# Patient Record
Sex: Female | Born: 1970 | Hispanic: Yes | Marital: Married | Smoking: Never smoker
Health system: Southern US, Community
[De-identification: ages and names within clinical notes are randomized; demographics above are authoritative.]

## PROBLEM LIST (undated history)

## (undated) DIAGNOSIS — N63 Unspecified lump in unspecified breast: Secondary | ICD-10-CM

## (undated) DIAGNOSIS — D649 Anemia, unspecified: Secondary | ICD-10-CM

## (undated) HISTORY — DX: Unspecified lump in unspecified breast: N63.0

---

## 2007-02-23 ENCOUNTER — Observation Stay: Payer: Self-pay | Admitting: Unknown Physician Specialty

## 2010-01-25 ENCOUNTER — Ambulatory Visit: Payer: Self-pay | Admitting: Nurse Practitioner

## 2010-08-08 ENCOUNTER — Ambulatory Visit: Payer: Self-pay | Admitting: Surgery

## 2011-02-26 ENCOUNTER — Ambulatory Visit: Payer: Self-pay | Admitting: Surgery

## 2012-02-28 ENCOUNTER — Ambulatory Visit: Payer: Self-pay | Admitting: Surgery

## 2013-02-08 ENCOUNTER — Ambulatory Visit: Payer: Self-pay | Admitting: Nurse Practitioner

## 2013-03-01 ENCOUNTER — Ambulatory Visit: Payer: Self-pay | Admitting: Nurse Practitioner

## 2013-04-01 ENCOUNTER — Ambulatory Visit: Payer: Self-pay | Admitting: Nurse Practitioner

## 2013-08-25 ENCOUNTER — Ambulatory Visit: Payer: Self-pay | Admitting: Surgery

## 2013-09-06 ENCOUNTER — Ambulatory Visit: Payer: Self-pay | Admitting: Surgery

## 2013-09-06 HISTORY — PX: BREAST MASS EXCISION: SHX1267

## 2013-09-06 HISTORY — PX: BREAST EXCISIONAL BIOPSY: SUR124

## 2013-09-08 LAB — PATHOLOGY REPORT

## 2014-05-20 HISTORY — PX: BREAST CYST ASPIRATION: SHX578

## 2014-05-20 HISTORY — PX: DILATION AND CURETTAGE OF UTERUS: SHX78

## 2014-05-20 HISTORY — PX: HYSTEROSCOPY: SHX211

## 2014-09-10 NOTE — Op Note (Signed)
PATIENT NAME:  Adriana Jenkins, Adriana Jenkins MR#:  [id number] DATE OF BIRTH:  January 06, 1971  DATE OF PROCEDURE:  09/06/2013  PREOPERATIVE DIAGNOSIS: Right breast mass.   POSTOPERATIVE DIAGNOSIS: Recurrent right breast cyst.  PROCEDURE PERFORMED: Excision of right breast mass.   SURGEON: Sherri Rad, M.D.   ASSISTANT: None.   ANESTHESIA: General and local.   FINDINGS: Breast cyst.   SPECIMENS: Breast cyst to pathology x2.   DESCRIPTION OF PROCEDURE: With informed consent, supine position, general anesthesia was induced. The right breast was marked in the holding area, sterilely prepped and draped with ChloraPrep solution. Timeout was observed. A 2 cm curvilinear incision at the nipple areolar complex was fashioned with a scalpel. Breast flaps were raised. The cyst was encountered and excised in its entirety with electrocautery. Hemostasis was achieved with both point cautery as well as figure-of-eight #3-0 Vicryl sutures, larger vessel. A smaller cyst inferior to this was then excised in its entirety with electrocautery. The wound was irrigated. A total of 10 mL of 0.25% plain Marcaine was infiltrated. With hemostasis obtained and no other palpable masses felt in the base of the wound or visible cyst tissue, the wound was then obliterated in the deep layer with 3-0 Vicryl, 3-0 Vicryl in the deep dermal layer, 4-0 Monocryl in the skin. Steri-Strips, Telfa, and Tegaderm were then applied, and the patient was subsequently extubated and taken to the recovery room in stable and satisfactory condition by anesthesia services.  ____________________________ Jeannette How Marina Gravel, MD mab:sb D: 09/06/2013 08:19:52 ET T: 09/06/2013 08:27:10 ET JOB#: [id number]  cc: Elta Guadeloupe A. Marina Gravel, MD, <Dictator> Hortencia Conradi MD ELECTRONICALLY SIGNED 09/08/2013 21:33

## 2014-12-27 ENCOUNTER — Ambulatory Visit (INDEPENDENT_AMBULATORY_CARE_PROVIDER_SITE_OTHER): Payer: Commercial Indemnity | Admitting: Surgery

## 2014-12-27 ENCOUNTER — Encounter: Payer: Self-pay | Admitting: Surgery

## 2014-12-27 VITALS — BP 134/84 | HR 80 | Temp 98.1°F | Ht 60.0 in | Wt 148.0 lb

## 2014-12-27 DIAGNOSIS — N6320 Unspecified lump in the left breast, unspecified quadrant: Secondary | ICD-10-CM | POA: Insufficient documentation

## 2014-12-27 DIAGNOSIS — N63 Unspecified lump in breast: Secondary | ICD-10-CM | POA: Diagnosis not present

## 2014-12-27 NOTE — Addendum Note (Signed)
Addended by: Wayna Chalet on: 12/27/2014 11:50 AM   Modules accepted: Orders

## 2014-12-27 NOTE — Patient Instructions (Signed)
Thurston Pounds a llamar con la cita para un Mamograma y un Ultrasonido. Despues que [employer], la llamaremos para hacerle una cita cone el [facility] [place].

## 2014-12-27 NOTE — Progress Notes (Signed)
Patient ID: Adriana Jenkins, female   DOB: 07-11-1970, 43 y.o.   MRN: [id number]  Chief Complaint  Patient presents with  . Other    Lump on Left Breast    HPI Location, Quality, Duration, Severity, Timing, Context, Modifying Factors, Associated Signs and Symptoms.  Adriana Jenkins is a 44 y.o. female.  With a history of a right breast benign cyst which I have aspirated in the office who presents to the office today with a left-sided breast mass. This is been present for about 6 weeks and causing her intermittent discomfort. There is been no nipple discharge. Her last mammogram was in year 2014. She is accompanied by her daughter.  Past Medical History  Diagnosis Date  . Lump or mass in breast     Past Surgical History  Procedure Laterality Date  . Breast mass excision Right 09/06/2013    Dr. Marina Gravel    History reviewed. No pertinent family history.  Social History History  Substance Use Topics  . Smoking status: Never Smoker   . Smokeless tobacco: Not on file  . Alcohol Use: No    No Known Allergies  No current outpatient prescriptions on file.   No current facility-administered medications for this visit.      Review of Systems A 10 point review of systems was asked and was negative except for the following positive findings as described above.  Blood pressure 134/84, pulse 80, temperature 98.1 F (36.7 C), temperature source Oral, height 5' (1.524 m), weight 67.132 kg (148 lb), last menstrual period 12/05/2014.  No results found for this or any previous visit (from the past 48 hour(s)). No results found.  Review of Systems  Constitutional: Negative for fever and chills.  HENT: Negative.   Eyes: Negative.   Respiratory: Negative.   Gastrointestinal: Negative.   Neurological: Negative.   Endo/Heme/Allergies: Negative.     Physical Exam  Constitutional: She is oriented to person, place, and time and well-developed, well-nourished, and in no distress. No  distress.  HENT:  Head: Normocephalic and atraumatic.  Eyes: Conjunctivae are normal. Pupils are equal, round, and reactive to light.  Neck: Normal range of motion.  Cardiovascular: Normal rate.   Pulmonary/Chest: Effort normal and breath sounds normal. Left breast exhibits mass. Left breast exhibits no inverted nipple, no nipple discharge, no skin change and no tenderness.    Neurological: She is oriented to person, place, and time.  Skin: Skin is warm and dry. She is not diaphoretic.  Psychiatric: Memory, affect and judgment normal.   Physical Exam CONSTITUTIONAL:  Pleasant, well-developed, well-nourished, and in no acute distress. EYES: Pupils equal and reactive to light, Sclera non-icteric EARS, NOSE, MOUTH AND THROAT:  The oropharynx was clear.  Dentition is good repair.  Oral mucosa pink and moist. LYMPH NODES:  Lymph nodes in the neck and axillae were normal RESPIRATORY:  Lungs were clear.  Normal respiratory effort without pathologic use of accessory muscles of respiration CARDIOVASCULAR: Heart was regular without murmurs.  There were no carotid bruits. GI: The abdomen was soft, nontender, and nondistended. There were no palpable masses. There was no hepatosplenomegaly. There were normal bowel sounds in all quadrants. GU:  Rectal deferred.   MUSCULOSKELETAL:  Normal muscle strength and tone.  No clubbing or cyanosis.   SKIN:  There were no pathologic skin lesions.  There were no nodules on palpation. NEUROLOGIC:  Sensation is normal.  Cranial nerves are grossly intact. PSYCH:  Oriented to person, place and time.  Mood and affect  are normal.  Data Reviewed None     Assessment    Aspiration of this likely cysts demonstrated no fluid. The patient tolerated procedure well. Left breast mass unclear etiology at this point. Likely solid.    Plan    I will send the patient for a bilateral mammogram and left directed ultrasound. Biopsy as needed. I'll see her back in the office  following the biopsy. She understands and wishes to proceed.     Sherri Rad MD, FACS 12/27/2014, 10:03 AM

## 2014-12-28 ENCOUNTER — Other Ambulatory Visit: Payer: Self-pay

## 2014-12-28 ENCOUNTER — Other Ambulatory Visit: Payer: Self-pay | Admitting: Surgery

## 2014-12-28 DIAGNOSIS — N6320 Unspecified lump in the left breast, unspecified quadrant: Secondary | ICD-10-CM

## 2014-12-28 NOTE — Progress Notes (Unsigned)
Spoke with Estill Bamberg at this time from [facility]. Explained situation regarding this patient, orders changed that needed to be changed per [facility]. She will cancel order for biopsy if this is not needed after testing done.

## 2014-12-29 ENCOUNTER — Encounter (INDEPENDENT_AMBULATORY_CARE_PROVIDER_SITE_OTHER): Payer: Self-pay

## 2014-12-30 ENCOUNTER — Telehealth: Payer: Self-pay

## 2014-12-30 NOTE — Telephone Encounter (Signed)
Spoke with Estill Bamberg at this time from [facility]. She received information from previous office visits and excision/pathology.  She will be calling the patient today to schedule as she has had difficulty getting an interpreter to be able to make the appointment over the phone.

## 2015-01-02 NOTE — Telephone Encounter (Signed)
Called Amanda at [facility] at this time to see if patient has been scheduled.

## 2015-01-03 ENCOUNTER — Other Ambulatory Visit: Payer: Self-pay | Admitting: Surgery

## 2015-01-03 ENCOUNTER — Telehealth: Payer: Self-pay

## 2015-01-03 DIAGNOSIS — N63 Unspecified lump in unspecified breast: Secondary | ICD-10-CM

## 2015-01-03 DIAGNOSIS — N6002 Solitary cyst of left breast: Secondary | ICD-10-CM

## 2015-01-03 NOTE — Telephone Encounter (Signed)
Patient called me asking for her appointment at [facility]. I told her that I would call them and ask. I told patient that I would return her call as soon as I got in contact with Estill Bamberg from [facility]. Patient agreed.  Called and spoke with Estill Bamberg from [facility] and asked if she had been able to schedule patient's appointments. She stated that she had tried contacting patient but no luck. I gave Estill Bamberg patient's cell phone number [phone]) and asked her to call patient at that number. Estill Bamberg stated that she would call her as soon as we hung up.   I then called patient to make sure that Estill Bamberg had called her and she stated that she had.

## 2015-01-03 NOTE — Telephone Encounter (Signed)
Please see Adriana Jenkins's note from 8/16

## 2015-01-03 NOTE — Telephone Encounter (Signed)
Estill Bamberg from [facility] will call patient with an interpreter.

## 2015-01-05 ENCOUNTER — Ambulatory Visit
Admission: RE | Admit: 2015-01-05 | Discharge: 2015-01-05 | Disposition: A | Discharge status: Home / Self Care | Payer: Managed Care, Other (non HMO) | Source: Ambulatory Visit | Attending: Surgery | Admitting: Surgery

## 2015-01-05 ENCOUNTER — Ambulatory Visit: Payer: Managed Care, Other (non HMO)

## 2015-01-05 ENCOUNTER — Ambulatory Visit: Payer: Self-pay

## 2015-01-05 DIAGNOSIS — N63 Unspecified lump in breast: Secondary | ICD-10-CM | POA: Diagnosis not present

## 2015-01-05 DIAGNOSIS — N6320 Unspecified lump in the left breast, unspecified quadrant: Secondary | ICD-10-CM

## 2015-04-04 ENCOUNTER — Emergency Department
Admission: EM | Admit: 2015-04-04 | Discharge: 2015-04-04 | Disposition: A | Discharge status: Home / Self Care | Payer: Managed Care, Other (non HMO) | Attending: Emergency Medicine | Admitting: Emergency Medicine

## 2015-04-04 ENCOUNTER — Encounter: Payer: Self-pay | Admitting: Emergency Medicine

## 2015-04-04 DIAGNOSIS — Z3202 Encounter for pregnancy test, result negative: Secondary | ICD-10-CM | POA: Insufficient documentation

## 2015-04-04 DIAGNOSIS — N946 Dysmenorrhea, unspecified: Secondary | ICD-10-CM | POA: Insufficient documentation

## 2015-04-04 DIAGNOSIS — N939 Abnormal uterine and vaginal bleeding, unspecified: Secondary | ICD-10-CM | POA: Diagnosis present

## 2015-04-04 DIAGNOSIS — N921 Excessive and frequent menstruation with irregular cycle: Secondary | ICD-10-CM | POA: Insufficient documentation

## 2015-04-04 LAB — CBC WITH DIFFERENTIAL/PLATELET
BASOS PCT: 1 %
Basophils Absolute: 0.1 10*3/uL (ref 0–0.1)
EOS ABS: 0.1 10*3/uL (ref 0–0.7)
Eosinophils Relative: 1 %
HEMATOCRIT: 24.4 % — AB (ref 35.0–47.0)
Hemoglobin: 7.8 g/dL — ABNORMAL LOW (ref 12.0–16.0)
Lymphocytes Relative: 25 %
Lymphs Abs: 1.8 10*3/uL (ref 1.0–3.6)
MCH: 25.5 pg — ABNORMAL LOW (ref 26.0–34.0)
MCHC: 32.1 g/dL (ref 32.0–36.0)
MCV: 79.5 fL — ABNORMAL LOW (ref 80.0–100.0)
MONO ABS: 0.5 10*3/uL (ref 0.2–0.9)
MONOS PCT: 7 %
NEUTROS ABS: 4.8 10*3/uL (ref 1.4–6.5)
Neutrophils Relative %: 66 %
Platelets: 469 10*3/uL — ABNORMAL HIGH (ref 150–440)
RBC: 3.07 MIL/uL — ABNORMAL LOW (ref 3.80–5.20)
RDW: 16 % — AB (ref 11.5–14.5)
WBC: 7.2 10*3/uL (ref 3.6–11.0)

## 2015-04-04 LAB — POCT PREGNANCY, URINE: Preg Test, Ur: NEGATIVE

## 2015-04-04 MED ORDER — MEDROXYPROGESTERONE ACETATE 10 MG PO TABS: 10.0000 mg | ORAL_TABLET | Freq: Every day | ORAL | Status: DC

## 2015-04-04 MED ORDER — MEDROXYPROGESTERONE ACETATE 10 MG PO TABS
10.0000 mg | ORAL_TABLET | Freq: Every day | ORAL | Status: DC
  Administered 2015-04-04: 10 mg via ORAL
  Filled 2015-04-04: qty 1

## 2015-04-04 NOTE — Discharge Instructions (Signed)
Return to ER for dizziness, passing out, worsening/heavier bleeding, or any other symptoms concerning to you.  Regrese a la emergencia si se siente mareado, si se siente peor pierde el conocimiento, sangrado pesado, u otros sintomas que Ud. siente.   Sangrado uterino anormal (Abnormal Uterine Bleeding) El sangrado uterino anormal puede afectar a las mujeres que estn en diversas etapas de la vida, desde adolescentes, mujeres frtiles y Games developer, hasta mujeres que han llegado a la menopausia. Hay diversas clases de sangrado uterino que se consideran anormales, entre ellas:  Prdidas de sangre o [employer] perodos.  Hemorragias luego de Retail banker.  Sangrado abundante o ms que lo habitual.  Perodos que duran ms que lo normal.  Sangrado luego de la menopausia. Muchos casos de sangrado uterino anormal son leves y simples de tratar, mientras que otros son ms graves. El mdico debe evaluar cualquier clase de sangrado anormal. El tratamiento depender de la causa del sangrado. INSTRUCCIONES PARA EL CUIDADO EN EL HOGAR Controle su afeccin para ver si hay cambios. Las siguientes indicaciones ayudarn a Chief Strategy Officer que pueda sentir:  Evite las duchas vaginales y el uso de tampones segn las indicaciones del mdico.  Nimrod compresas con frecuencia. Deber hacerse exmenes plvicos regulares y pruebas de Papanicolaou. Cumpla con todas las visitas de control y [employer] diagnsticos, segn le indique su mdico.  SOLICITE ATENCIN MDICA SI:   El sangrado dura ms de 1 semana.  Se siente mareada por momentos. SOLICITE ATENCIN MDICA DE INMEDIATO SI:   Se desmaya.  Debe cambiarse la compresa cada 15 a 30 minutos.  Siente dolor abdominal.  Jaclynn Guarneri.  Se siente dbil o presenta sudoracin.  Elimina cogulos grandes por la vagina.  Comienza a sentir nuseas y [place]. ASEGRESE DE QUE:   Comprende estas  instrucciones.  Controlar su afeccin.  Recibir ayuda de inmediato si no mejora o si empeora.   Esta informacin no tiene Marine scientist el consejo del mdico. Asegrese de hacerle al mdico cualquier pregunta que tenga.   Document Released: 05/06/2005 Document Revised: 05/11/2013 Elsevier Interactive Patient Education 2016 [place] (Metrorrhagia) La metrorragia es una hemorragia procedente del tero que ocurre de manera irregular pero frecuente. La hemorragia suele presentarse entre una menstruacin y la siguiente. INSTRUCCIONES PARA EL CUIDADO EN EL HOGAR Est atenta a cualquier cambio en los sntomas. Estas indicaciones pueden ayudarla con el trastorno: Comidas  Siga una dieta equilibrada. Incluya alimentos con [employer]els de hierro, como hgado, carne, Occupational hygienist, verduras de hoja verde y [place].  Si tiene estreimiento:  Beba abundante agua.  Consuma frutas y verduras con alto contenido de agua y [place], [place] espinaca, zanahorias, frambuesas, manzanas y mango. Medicamentos  Delphi de venta libre y los recetados solamente como se lo haya indicado el mdico.  No haga cambios en los medicamentos sin hablar con el mdico.  La aspirina o los medicamentos que la contienen pueden aumentar la hemorragia. No tome esos medicamentos:  Durante la semana previa a Hydrographic surveyor.  Durante la [employer]ott.  Si le recetaron comprimidos de hierro, Social research officer, government se lo haya indicado el mdico. Estos ayudan a Camera operator hierro que el organismo pierde debido a este trastorno. Actividad  Si debe cambiarse el apsito o el tampn ms de una vez cada 2horas:  Acustese con los pies elevados.  Colquese una compresa fra en la parte baja del abdomen.  Haga todo el reposo que pueda hasta que la hemorragia se Production assistant, radio  o disminuya.  No trate de Psychologist, counselling que la hemorragia se detenga y los niveles de hierro en la sangre se normalicen. Otras  indicaciones  [employer], anote lo siguiente:  La fecha de comienzo de Hydrographic surveyor.  La fecha de su finalizacin.  Los [employer] que tiene una hemorragia anormal.  Los problemas que advierte.  Concurra a todas las visitas de control como se lo haya indicado el mdico. Esto es importante. SOLICITE ATENCIN MDICA SI:  Se siente dbil o que va a desvanecerse.  Tiene nuseas y vmitos.  No puede comer ni beber sin vomitar.  Tiene mareos o diarrea mientras est [employer]rop [employer]man.  Est tomando anticonceptivos u hormonas, y desea cambiar o suspender estos medicamentos. SOLICITE ATENCIN MDICA DE INMEDIATO SI:  Tiene escalofros o fiebre.  Debe cambiarse el apsito o el tampn ms de una vez por hora.  La hemorragia se vuelve abundante.  El flujo menstrual contiene cogulos.  Siente dolor en el abdomen.  Pierde la conciencia.  Le aparece una erupcin cutnea.   Esta informacin no tiene Marine scientist el consejo del mdico. Asegrese de hacerle al mdico cualquier pregunta que tenga.   Document Released: 02/13/2005 Document Revised: 01/25/2015 Elsevier Interactive Patient Education [employer].

## 2015-04-04 NOTE — ED Notes (Addendum)
Pt to ed with c/o vaginal bleeding since October 15th.  Pt now reports feeling weak.  Pt states she has used approx 8 pads today.

## 2015-04-04 NOTE — ED Notes (Signed)
RN going over discharge instruction with interpretorer

## 2015-04-04 NOTE — ED Notes (Signed)
Interpreter and MD at bedside

## 2015-04-04 NOTE — ED Provider Notes (Signed)
[facility] Emergency Department Provider Note   ____________________________________________  Time seen:  I have reviewed the triage vital signs and the triage nursing note.  HISTORY  Chief Complaint Vaginal Bleeding   Historian Patient and spouse through [place] interpreter  HPI Adriana Jenkins is a 44 y.o. female is here for evaluation of vaginal bleeding times one month. Patient states she did not have a period in September and then started around October 15 and has been bleeding since then. Patient states the bleeding has been pretty heavy all this time. She changes a pad at work about every hour and a half. She is complaining of feeling weak, but no dizziness or passing out. No chest pain or trouble breathing.  Not on any hormonal birth control. Symptoms are moderate. No vaginal pain. No pelvic or abdominal pain.    Past Medical History  Diagnosis Date  . Lump or mass in breast     Patient Active Problem List   Diagnosis Date Noted  . Mass of breast, left 12/27/2014    Past Surgical History  Procedure Laterality Date  . Breast mass excision Right 09/06/2013    Dr. Marina Gravel  . Breast cyst aspiration Right     neg    Current Outpatient Rx  Name  Route  Sig  Dispense  Refill  . medroxyPROGESTERone (PROVERA) 10 MG tablet   Oral   Take 1 tablet (10 mg total) by mouth daily.   10 tablet   0     Allergies Review of patient's allergies indicates no known allergies.  History reviewed. No pertinent family history.  Social History Social History  Substance Use Topics  . Smoking status: Never Smoker   . Smokeless tobacco: None  . Alcohol Use: No    Review of Systems  Constitutional: Negative for fever. Eyes: Negative for visual changes. ENT: Negative for sore throat. Cardiovascular: Negative for chest pain. Respiratory: Negative for shortness of breath. Gastrointestinal: Negative for abdominal pain, vomiting and  diarrhea. Genitourinary: Negative for dysuria. Musculoskeletal: Negative for back pain. Skin: Negative for rash. Neurological: Negative for headache. 10 point Review of Systems otherwise negative ____________________________________________   PHYSICAL EXAM:  VITAL SIGNS: ED Triage Vitals  Enc Vitals Group     BP 04/04/15 1323 129/73 mmHg     Pulse Rate 04/04/15 1323 94     Resp 04/04/15 1323 20     Temp --      Temp Source 04/04/15 1323 Oral     SpO2 04/04/15 1323 98 %     Weight 04/04/15 1323 148 lb (67.132 kg)     Height --      Head Cir --      Peak Flow --      Pain Score 04/04/15 1324 0     Pain Loc --      Pain Edu? --      Excl. in [place]? --      Constitutional: Alert and oriented. Well appearing and in no distress. Eyes: Conjunctivae are normal. PERRL. Normal extraocular movements. ENT   Head: Normocephalic and atraumatic.   Nose: No congestion/rhinnorhea.   Mouth/Throat: Mucous membranes are moist.   Neck: No stridor. Cardiovascular/Chest: Normal rate, regular rhythm.  No murmurs, rubs, or gallops. Respiratory: Normal respiratory effort without tachypnea nor retractions. Breath sounds are clear and equal bilaterally. No wheezes/rales/rhonchi. Gastrointestinal: Soft. No distention, no guarding, no rebound. Nontender   Genitourinary/rectal: Closed os. Moderate blood with clots in the vault. Nontender cervix. Musculoskeletal: Nontender  with normal range of motion in all extremities. No joint effusions.  No lower extremity tenderness.  No edema. Neurologic:  Normal speech and language. No gross or focal neurologic deficits are appreciated. Skin:  Skin is warm, dry and intact. No rash noted. Psychiatric: Mood and affect are normal. Speech and behavior are normal. Patient exhibits appropriate insight and judgment.  ____________________________________________   EKG I, Lisa Roca, MD, the attending physician have personally viewed and interpreted all  ECGs.  None ____________________________________________  LABS (pertinent positives/negatives)  Urine pregnancy test negative White blood cell count 7.2, hemoglobin 7.8, platelet count 469  ____________________________________________  RADIOLOGY All Xrays were viewed by me. Imaging interpreted by Radiologist.  None __________________________________________  PROCEDURES  Procedure(s) performed: None  Critical Care performed: None  ____________________________________________   ED COURSE / ASSESSMENT AND PLAN  CONSULTATIONS: Phone consultation with Dr. Kenton Kingfisher, ob gyn - recommends starting on provera 10mg  daily x10 days.  Patient to call and be seen this week in the office for follow up and ultrasound.  Pertinent labs & imaging results that were available during my care of the patient were reviewed by me and considered in my medical decision making (see chart for details).   Patient is clinically stable, no tachycardia, no hypotension, but she does have anemia to 7.8 . Due to the anemia, Dr. Kenton Kingfisher recommended to go ahead and start her on Provera now. She will be followed up in the office this week. We discussed return precautions through the interpreter.  Patient / Family / Caregiver informed of clinical course, medical decision-making process, and agree with plan.   I discussed return precautions, follow-up instructions, and discharged instructions with patient and/or family.  ___________________________________________   FINAL CLINICAL IMPRESSION(S) / ED DIAGNOSES   Final diagnoses:  Dysmenorrhea  Menorrhagia with irregular cycle       Lisa Roca, MD 04/04/15 [phone]

## 2015-04-04 NOTE — ED Notes (Addendum)
Discharge instructions gone over with interpreter present. Waiting on medication from pharmacy before discharged.

## 2015-04-04 NOTE — ED Notes (Addendum)
Interpreter at bedside.

## 2015-04-23 ENCOUNTER — Encounter: Payer: Self-pay | Admitting: Emergency Medicine

## 2015-04-23 ENCOUNTER — Emergency Department
Admission: EM | Admit: 2015-04-23 | Discharge: 2015-04-24 | Disposition: A | Discharge status: Home / Self Care | Payer: Managed Care, Other (non HMO) | Attending: Emergency Medicine | Admitting: Emergency Medicine

## 2015-04-23 DIAGNOSIS — R109 Unspecified abdominal pain: Secondary | ICD-10-CM | POA: Diagnosis present

## 2015-04-23 DIAGNOSIS — R1030 Lower abdominal pain, unspecified: Secondary | ICD-10-CM | POA: Insufficient documentation

## 2015-04-23 DIAGNOSIS — R102 Pelvic and perineal pain: Secondary | ICD-10-CM | POA: Insufficient documentation

## 2015-04-23 DIAGNOSIS — Z3202 Encounter for pregnancy test, result negative: Secondary | ICD-10-CM | POA: Diagnosis not present

## 2015-04-23 DIAGNOSIS — Z79899 Other long term (current) drug therapy: Secondary | ICD-10-CM | POA: Insufficient documentation

## 2015-04-23 HISTORY — DX: Anemia, unspecified: D64.9

## 2015-04-23 LAB — COMPREHENSIVE METABOLIC PANEL
ALBUMIN: 3.5 g/dL (ref 3.5–5.0)
ALK PHOS: 95 U/L (ref 38–126)
ALT: 9 U/L — AB (ref 14–54)
ANION GAP: 5 (ref 5–15)
AST: 10 U/L — AB (ref 15–41)
BUN: 10 mg/dL (ref 6–20)
CALCIUM: 8.2 mg/dL — AB (ref 8.9–10.3)
CO2: 24 mmol/L (ref 22–32)
Chloride: 103 mmol/L (ref 101–111)
Creatinine, Ser: 0.48 mg/dL (ref 0.44–1.00)
GFR calc Af Amer: >60 mL/min (ref >60)
GFR calc non Af Amer: >60 mL/min (ref >60)
GLUCOSE: 106 mg/dL — AB (ref 65–99)
Potassium: 3.5 mmol/L (ref 3.5–5.1)
SODIUM: 132 mmol/L — AB (ref 135–145)
Total Bilirubin: 0.2 mg/dL — ABNORMAL LOW (ref 0.3–1.2)
Total Protein: 6.9 g/dL (ref 6.5–8.1)

## 2015-04-23 LAB — URINALYSIS COMPLETE WITH MICROSCOPIC (ARMC ONLY)
BACTERIA UA: NONE SEEN
Bilirubin Urine: NEGATIVE
Glucose, UA: NEGATIVE mg/dL
Ketones, ur: NEGATIVE mg/dL
NITRITE: NEGATIVE
PROTEIN: NEGATIVE mg/dL
SPECIFIC GRAVITY, URINE: 1.014 (ref 1.005–1.030)
pH: 6 (ref 5.0–8.0)

## 2015-04-23 LAB — PREGNANCY, URINE: PREG TEST UR: NEGATIVE

## 2015-04-23 LAB — CBC WITH DIFFERENTIAL/PLATELET
BASOS ABS: 0 10*3/uL (ref 0–0.1)
BASOS PCT: 0 %
EOS ABS: 0 10*3/uL (ref 0–0.7)
Eosinophils Relative: 0 %
HCT: 24.5 % — ABNORMAL LOW (ref 35.0–47.0)
HEMOGLOBIN: 7.9 g/dL — AB (ref 12.0–16.0)
Lymphocytes Relative: 7 %
Lymphs Abs: 1.1 10*3/uL (ref 1.0–3.6)
MCH: 25.3 pg — ABNORMAL LOW (ref 26.0–34.0)
MCHC: 32.3 g/dL (ref 32.0–36.0)
MCV: 78.4 fL — ABNORMAL LOW (ref 80.0–100.0)
Monocytes Absolute: 0.8 10*3/uL (ref 0.2–0.9)
Monocytes Relative: 5 %
NEUTROS PCT: 88 %
Neutro Abs: 13.4 10*3/uL — ABNORMAL HIGH (ref 1.4–6.5)
Platelets: 410 10*3/uL (ref 150–440)
RBC: 3.13 MIL/uL — AB (ref 3.80–5.20)
RDW: 16.3 % — ABNORMAL HIGH (ref 11.5–14.5)
WBC: 15.4 10*3/uL — AB (ref 3.6–11.0)

## 2015-04-23 LAB — LIPASE, BLOOD: Lipase: 24 U/L (ref 11–51)

## 2015-04-23 MED ORDER — SODIUM CHLORIDE 0.9 % IV BOLUS (SEPSIS)
1000.0000 mL | Freq: Once | INTRAVENOUS | Status: AC
  Administered 2015-04-23: 1000 mL via INTRAVENOUS

## 2015-04-23 MED ORDER — MORPHINE SULFATE (PF) 4 MG/ML IV SOLN
4.0000 mg | Freq: Once | INTRAVENOUS | Status: AC
  Administered 2015-04-23: 4 mg via INTRAVENOUS

## 2015-04-23 MED ORDER — ONDANSETRON HCL 4 MG/2ML IJ SOLN
INTRAMUSCULAR | Status: AC
  Administered 2015-04-23: 4 mg via INTRAVENOUS
  Filled 2015-04-23: qty 2

## 2015-04-23 MED ORDER — OXYCODONE-ACETAMINOPHEN 5-325 MG PO TABS
1.0000 | ORAL_TABLET | Freq: Once | ORAL | Status: AC
Start: 2015-04-23 — End: 2015-04-23
  Administered 2015-04-23: 1 via ORAL

## 2015-04-23 MED ORDER — ONDANSETRON HCL 4 MG/2ML IJ SOLN
4.0000 mg | Freq: Once | INTRAMUSCULAR | Status: AC
  Administered 2015-04-23: 4 mg via INTRAVENOUS

## 2015-04-23 MED ORDER — MORPHINE SULFATE (PF) 4 MG/ML IV SOLN
INTRAVENOUS | Status: AC
  Administered 2015-04-23: 4 mg via INTRAVENOUS
  Filled 2015-04-23: qty 1

## 2015-04-23 MED ORDER — OXYCODONE-ACETAMINOPHEN 5-325 MG PO TABS
ORAL_TABLET | ORAL | Status: AC
  Administered 2015-04-23: 1 via ORAL
  Filled 2015-04-23: qty 1

## 2015-04-23 NOTE — ED Notes (Addendum)
C/o lower abdominal pain x 2-3 weeks.  Denies any history of similar pain.  C/o urinating small amounts urine with each void, also c/o pain with urination that  Started yesterday.  Seen by PCP last Tuesday for abdominal pain. Ultra sound done and patient "has something on uterus.  Has surgery scheduled for 12/28 for hysterectomy."  Also diagnosed with anemia.

## 2015-04-23 NOTE — ED Provider Notes (Signed)
[facility] Emergency Department Provider Note  ____________________________________________  Time seen: Approximately [id number] PM  I have reviewed the triage vital signs and the nursing notes.   HISTORY  Chief Complaint Abdominal Pain  Interpreter,Hiram, used for interpretation from Spanish to [place].  HPI Adriana Jenkins is a 44 y.o. female with a history of recent heavy vaginal bleeding with anemia who is presenting today with lower abdominal and pelvic pain. She says that the bleeding is improved since being placed on birth control pills. However, starting last night she has been having intermittent lower abdominal pain which has worsened. She says it is cramping and is now on 8 out of 10 to a 10 out of 10. She denies any nausea or vomiting. Says that she has been seen multiple times at OB/GYN this week at [facility] side OB/[facility]N and was told she had something on her ovary that may need an operation.   Past Medical History  Diagnosis Date  . Lump or mass in breast   . Anemia     Patient Active Problem List   Diagnosis Date Noted  . Mass of breast, left 12/27/2014    Past Surgical History  Procedure Laterality Date  . Breast mass excision Right 09/06/2013    Dr. Marina Gravel  . Breast cyst aspiration Right     neg    Current Outpatient Rx  Name  Route  Sig  Dispense  Refill  . medroxyPROGESTERone (PROVERA) 10 MG tablet   Oral   Take 1 tablet (10 mg total) by mouth daily.   10 tablet   0     Allergies Review of patient's allergies indicates no known allergies.  No family history on file.  Social History Social History  Substance Use Topics  . Smoking status: Never Smoker   . Smokeless tobacco: None  . Alcohol Use: No    Review of Systems Constitutional: No fever/chills Eyes: No visual changes. ENT: No sore throat. Cardiovascular: Denies chest pain. Respiratory: Denies shortness of breath. Gastrointestinal:  No nausea, no vomiting.  No  diarrhea.  No constipation. Genitourinary: Negative for dysuria. Musculoskeletal: Says the abdominal pain radiates to her back.  Skin: Negative for rash. Neurological: Negative for headaches, focal weakness or numbness.  10-point ROS otherwise negative.  ____________________________________________   PHYSICAL EXAM:  VITAL SIGNS: ED Triage Vitals  Enc Vitals Group     BP 04/23/15 1936 116/57 mmHg     Pulse Rate 04/23/15 1936 105     Resp 04/23/15 1936 18     Temp 04/23/15 1936 97.9 F (36.6 C)     Temp Source 04/23/15 1936 Oral     SpO2 04/23/15 1936 98 %     Weight 04/23/15 1936 149 lb (67.586 kg)     Height 04/23/15 1936 5' (1.524 m)     Head Cir --      Peak Flow --      Pain Score 04/23/15 1937 8     Pain Loc --      Pain Edu? --      Excl. in [place]? --     Constitutional: Alert and oriented. Appears uncomfortable. No acute distress.  Eyes: Conjunctivae are normal. PERRL. EOMI. Head: Atraumatic. Nose: No congestion/rhinnorhea. Mouth/Throat: Mucous membranes are moist.  Oropharynx non-erythematous. Neck: No stridor.   Cardiovascular: Normal rate, regular rhythm. Grossly normal heart sounds.  Good peripheral circulation. Respiratory: Normal respiratory effort.  No retractions. Lungs CTAB. Gastrointestinal: Soft with mild to moderate lower abdominal tenderness. There  is no rebound or guarding.. No distention. No abdominal bruits. No CVA tenderness. Musculoskeletal: No lower extremity tenderness nor edema.  No joint effusions. Neurologic:  Normal speech and language. No gross focal neurologic deficits are appreciated. No gait instability. Skin:  Skin is warm, dry and intact. No rash noted. Psychiatric: Mood and affect are normal. Speech and behavior are normal.  ____________________________________________   LABS (all labs ordered are listed, but only abnormal results are displayed)  Labs Reviewed  CBC WITH DIFFERENTIAL/PLATELET - Abnormal; Notable for the following:     WBC 15.4 (*)    RBC 3.13 (*)    Hemoglobin 7.9 (*)    HCT 24.5 (*)    MCV 78.4 (*)    MCH 25.3 (*)    RDW 16.3 (*)    Neutro Abs 13.4 (*)    All other components within normal limits  COMPREHENSIVE METABOLIC PANEL - Abnormal; Notable for the following:    Sodium 132 (*)    Glucose, Bld 106 (*)    Calcium 8.2 (*)    AST 10 (*)    ALT 9 (*)    Total Bilirubin 0.2 (*)    All other components within normal limits  URINALYSIS COMPLETEWITH MICROSCOPIC ([facility] ONLY) - Abnormal; Notable for the following:    Color, Urine YELLOW (*)    APPearance CLEAR (*)    Hgb urine dipstick 3+ (*)    Leukocytes, UA TRACE (*)    Squamous Epithelial / LPF 0-5 (*)    All other components within normal limits  LIPASE, BLOOD  PREGNANCY, URINE  POC URINE PREG, ED   ____________________________________________  EKG   ____________________________________________  RADIOLOGY   ____________________________________________   PROCEDURES    ____________________________________________   INITIAL IMPRESSION / ASSESSMENT AND PLAN / ED COURSE  Pertinent labs & imaging results that were available during my care of the patient were reviewed by me and considered in my medical decision making (see chart for details).  ----------------------------------------- 1030 PM on 04/23/2015 ----------------------------------------- Discussed case with Dr. Leonides Schanz of OB/GYN at [facility] side. I previously called her at about 9:30 PM and she said she needed time to put the patient's report. At this time she is able to see the patient supports and says that she has a largely thickened endometrium of about 4 cm. She says that she was on is a very small cysts in her bilateral adnexa which she does not think would be able to cause torsion. Dr. Leonides Schanz thinks that likely the patient is having contractions of her uterus and an effort for the uterus to force out the contents. She recommends switching back to the Provera from the  [place] that the patient is currently on at this time. She ordered the prescription on line to the [facility] in [place]. Says that she has a D&C scheduled on the 29th of this month but recommends sooner follow-up.  Patient also with anemia but unchanged from her baseline. Is not having any shortness of breath or feelings of syncope.  ----------------------------------------- 12:24 AM on 04/24/2015 -----------------------------------------  Patient is now pain-free after Percocet. I reviewed with her in detail my conversation with Dr. Leonides Schanz as well as the plan for outpatient follow-up. She knows that she has to pick up her new medication for Provera at the [facility] in [place] tomorrow morning. She will be given a prescription for Percocet she also knows to stop the[place].  I discussed also in detail return precautions including any bleeding or worsening pain or  any other concerning symptoms. We also discussed the likely uterine spasm/contraction causing this pain. I think is much less likely that this was a GI cause such as an appendicitis. The patient did not have any GI symptoms and has known GYN pathology that is consistent with this presentation. The patient also confirms that she only has a very small amount of bleeding still.    ____________________________________________   FINAL CLINICAL IMPRESSION(S) / ED DIAGNOSES  Pelvic pain likely secondary to uterine spasm.    Orbie Pyo, MD 04/24/15 Laureen Abrahams

## 2015-04-23 NOTE — ED Notes (Signed)
Pt observed ambulating without difficulty to restroom at this time. Will continue to monitor.

## 2015-04-24 MED ORDER — OXYCODONE-ACETAMINOPHEN 5-325 MG PO TABS: 1.0000 | ORAL_TABLET | Freq: Four times a day (QID) | ORAL | Status: DC | PRN

## 2015-04-24 MED ORDER — SODIUM CHLORIDE 0.9 % IV BOLUS (SEPSIS)
1000.0000 mL | Freq: Once | INTRAVENOUS | Status: AC
  Administered 2015-04-24: 1000 mL via INTRAVENOUS

## 2015-04-24 NOTE — ED Notes (Signed)
Provided pt with crackers and ginger ale per her request and ok per MD. Pt resting quietly on stretcher with NS infusing at this time. No acute distress noted. Will continue to monitor.

## 2015-05-03 ENCOUNTER — Inpatient Hospital Stay: Admission: RE | Admit: 2015-05-03 | Payer: Managed Care, Other (non HMO) | Source: Ambulatory Visit

## 2015-05-04 ENCOUNTER — Encounter
Admission: RE | Disposition: A | Discharge status: Home / Self Care | Payer: Self-pay | Source: Ambulatory Visit | Attending: Obstetrics & Gynecology

## 2015-05-04 ENCOUNTER — Ambulatory Visit: Payer: Managed Care, Other (non HMO) | Admitting: Anesthesiology

## 2015-05-04 ENCOUNTER — Ambulatory Visit
Admission: RE | Admit: 2015-05-04 | Discharge: 2015-05-04 | Disposition: A | Discharge status: Home / Self Care | Payer: Managed Care, Other (non HMO) | Source: Ambulatory Visit | Attending: Obstetrics & Gynecology | Admitting: Obstetrics & Gynecology

## 2015-05-04 DIAGNOSIS — N72 Inflammatory disease of cervix uteri: Secondary | ICD-10-CM | POA: Diagnosis not present

## 2015-05-04 DIAGNOSIS — N711 Chronic inflammatory disease of uterus: Secondary | ICD-10-CM | POA: Diagnosis not present

## 2015-05-04 DIAGNOSIS — N840 Polyp of corpus uteri: Secondary | ICD-10-CM | POA: Insufficient documentation

## 2015-05-04 DIAGNOSIS — Z79891 Long term (current) use of opiate analgesic: Secondary | ICD-10-CM | POA: Diagnosis not present

## 2015-05-04 DIAGNOSIS — Z9889 Other specified postprocedural states: Secondary | ICD-10-CM | POA: Diagnosis not present

## 2015-05-04 DIAGNOSIS — N939 Abnormal uterine and vaginal bleeding, unspecified: Secondary | ICD-10-CM | POA: Diagnosis present

## 2015-05-04 DIAGNOSIS — Z79890 Hormone replacement therapy (postmenopausal): Secondary | ICD-10-CM | POA: Insufficient documentation

## 2015-05-04 DIAGNOSIS — N920 Excessive and frequent menstruation with regular cycle: Secondary | ICD-10-CM | POA: Diagnosis present

## 2015-05-04 HISTORY — PX: DILITATION & CURRETTAGE/HYSTROSCOPY WITH NOVASURE ABLATION: SHX5568

## 2015-05-04 LAB — CBC
HEMATOCRIT: 25.3 % — AB (ref 35.0–47.0)
HEMOGLOBIN: 7.9 g/dL — AB (ref 12.0–16.0)
MCH: 24.2 pg — AB (ref 26.0–34.0)
MCHC: 31.4 g/dL — AB (ref 32.0–36.0)
MCV: 77.3 fL — AB (ref 80.0–100.0)
Platelets: 488 10*3/uL — ABNORMAL HIGH (ref 150–440)
RBC: 3.28 MIL/uL — AB (ref 3.80–5.20)
RDW: 15.9 % — ABNORMAL HIGH (ref 11.5–14.5)
WBC: 6.4 10*3/uL (ref 3.6–11.0)

## 2015-05-04 LAB — TYPE AND SCREEN
ABO/RH(D): O POS
Antibody Screen: NEGATIVE

## 2015-05-04 LAB — ABO/RH: ABO/RH(D): O POS

## 2015-05-04 LAB — POCT PREGNANCY, URINE: PREG TEST UR: NEGATIVE

## 2015-05-04 SURGERY — DILATATION & CURETTAGE/HYSTEROSCOPY WITH NOVASURE ABLATION
Anesthesia: General

## 2015-05-04 MED ORDER — OXYCODONE-ACETAMINOPHEN 5-325 MG PO TABS: 1.0000 | ORAL_TABLET | ORAL | Status: DC | PRN

## 2015-05-04 MED ORDER — ACETAMINOPHEN 325 MG PO TABS: 650.0000 mg | ORAL_TABLET | ORAL | Status: DC | PRN

## 2015-05-04 MED ORDER — PROPOFOL 10 MG/ML IV BOLUS
INTRAVENOUS | Status: DC | PRN
  Administered 2015-05-04: 50 mg via INTRAVENOUS
  Administered 2015-05-04: 150 mg via INTRAVENOUS

## 2015-05-04 MED ORDER — DEXAMETHASONE SODIUM PHOSPHATE 10 MG/ML IJ SOLN
INTRAMUSCULAR | Status: DC | PRN
  Administered 2015-05-04: 10 mg via INTRAVENOUS

## 2015-05-04 MED ORDER — FAMOTIDINE 20 MG PO TABS
20.0000 mg | ORAL_TABLET | Freq: Once | ORAL | Status: AC
  Administered 2015-05-04: 20 mg via ORAL

## 2015-05-04 MED ORDER — LIDOCAINE HCL (CARDIAC) 20 MG/ML IV SOLN
INTRAVENOUS | Status: DC | PRN
  Administered 2015-05-04: 100 mg via INTRAVENOUS

## 2015-05-04 MED ORDER — OXYCODONE HCL 5 MG/5ML PO SOLN: 5.0000 mg | Freq: Once | ORAL | Status: DC | PRN

## 2015-05-04 MED ORDER — OXYCODONE HCL 5 MG PO TABS: 5.0000 mg | ORAL_TABLET | Freq: Once | ORAL | Status: DC | PRN

## 2015-05-04 MED ORDER — MIDAZOLAM HCL 5 MG/5ML IJ SOLN
INTRAMUSCULAR | Status: DC | PRN
  Administered 2015-05-04: 2 mg via INTRAVENOUS

## 2015-05-04 MED ORDER — FENTANYL CITRATE (PF) 100 MCG/2ML IJ SOLN
INTRAMUSCULAR | Status: DC | PRN
  Administered 2015-05-04 (×2): 50 ug via INTRAVENOUS

## 2015-05-04 MED ORDER — ACETAMINOPHEN 650 MG RE SUPP
650.0000 mg | RECTAL | Status: DC | PRN
  Filled 2015-05-04: qty 1

## 2015-05-04 MED ORDER — FAMOTIDINE 20 MG PO TABS
ORAL_TABLET | ORAL | Status: AC
  Filled 2015-05-04: qty 1

## 2015-05-04 MED ORDER — ONDANSETRON HCL 4 MG/2ML IJ SOLN
INTRAMUSCULAR | Status: DC | PRN
  Administered 2015-05-04: 4 mg via INTRAVENOUS

## 2015-05-04 MED ORDER — MORPHINE SULFATE (PF) 2 MG/ML IV SOLN: 1.0000 mg | INTRAVENOUS | Status: DC | PRN

## 2015-05-04 MED ORDER — LACTATED RINGERS IV SOLN
INTRAVENOUS | Status: DC
  Administered 2015-05-04 (×2): via INTRAVENOUS

## 2015-05-04 MED ORDER — FENTANYL CITRATE (PF) 100 MCG/2ML IJ SOLN: 25.0000 ug | INTRAMUSCULAR | Status: DC | PRN

## 2015-05-04 SURGICAL SUPPLY — 15 items
CANISTER SUCT 3000ML (MISCELLANEOUS) ×2 IMPLANT
CATH ROBINSON RED A/P 16FR (CATHETERS) ×2 IMPLANT
GLOVE BIO SURGEON STRL SZ8 (GLOVE) ×2 IMPLANT
GOWN STRL REUS W/ TWL LRG LVL3 (GOWN DISPOSABLE) ×1 IMPLANT
GOWN STRL REUS W/ TWL XL LVL3 (GOWN DISPOSABLE) ×1 IMPLANT
GOWN STRL REUS W/TWL LRG LVL3 (GOWN DISPOSABLE) ×1
GOWN STRL REUS W/TWL XL LVL3 (GOWN DISPOSABLE) ×1
IV LACTATED RINGERS 1000ML (IV SOLUTION) ×2 IMPLANT
NOVASURE ENDOMETRIAL ABLATION (MISCELLANEOUS) IMPLANT
NS IRRIG 500ML POUR BTL (IV SOLUTION) ×2 IMPLANT
PACK DNC HYST (MISCELLANEOUS) ×2 IMPLANT
PAD OB MATERNITY 4.3X12.25 (PERSONAL CARE ITEMS) ×2 IMPLANT
PAD PREP 24X41 OB/GYN DISP (PERSONAL CARE ITEMS) ×2 IMPLANT
STRAP SAFETY BODY (MISCELLANEOUS) ×2 IMPLANT
TUBING CONNECTING 10 (TUBING) ×2 IMPLANT

## 2015-05-04 NOTE — Anesthesia Preprocedure Evaluation (Signed)
Anesthesia Evaluation  Patient identified by MRN, date of birth, ID band Patient awake    Reviewed: Allergy & Precautions, H&P , NPO status , Patient's Chart, lab work & pertinent test results  History of Anesthesia Complications (+) PONV and history of anesthetic complications  Airway Mallampati: III  TM Distance: >3 FB Neck ROM: full    Dental  (+) Poor Dentition, Chipped   Pulmonary neg pulmonary ROS, neg shortness of breath,    Pulmonary exam normal breath sounds clear to auscultation       Cardiovascular Exercise Tolerance: Good (-) Past MI negative cardio ROS Normal cardiovascular exam Rhythm:regular Rate:Normal     Neuro/Psych negative neurological ROS  negative psych ROS   GI/Hepatic negative GI ROS, Neg liver ROS,   Endo/Other  negative endocrine ROS  Renal/GU negative Renal ROS  negative genitourinary   Musculoskeletal   Abdominal   Peds  Hematology negative hematology ROS (+)   Anesthesia Other Findings Past Medical History:   Lump or mass in breast                                       Anemia                                                      Past Surgical History:   BREAST MASS EXCISION                            Right 09/06/2013     Comment:Dr. Marina Gravel   BREAST CYST ASPIRATION                          Right                Comment:neg  BMI    Body Mass Index   29.29 kg/m 2      Reproductive/Obstetrics negative OB ROS                             Anesthesia Physical Anesthesia Plan  ASA: II  Anesthesia Plan: General LMA   Post-op Pain Management:    Induction:   Airway Management Planned:   Additional Equipment:   Intra-op Plan:   Post-operative Plan:   Informed Consent: I have reviewed the patients History and Physical, chart, labs and discussed the procedure including the risks, benefits and alternatives for the proposed anesthesia with the patient or  authorized representative who has indicated his/her understanding and acceptance.   Dental Advisory Given  Plan Discussed with: Anesthesiologist, CRNA and Surgeon  Anesthesia Plan Comments:         Anesthesia Quick Evaluation

## 2015-05-04 NOTE — Discharge Instructions (Signed)
Hysteroscopy, Care After Refer to this sheet in the next few weeks. These instructions provide you with information on caring for yourself after your procedure. Your health care provider may also give you more specific instructions. Your treatment has been planned according to current medical practices, but problems sometimes occur. Call your health care provider if you have any problems or questions after your procedure.  WHAT TO EXPECT AFTER THE PROCEDURE After your procedure, it is typical to have the following:  You may have some cramping. This normally lasts for a couple days.  You may have bleeding. This can vary from light spotting for a few days to menstrual-like bleeding for 3-7 days. HOME CARE INSTRUCTIONS  Rest for the first 1-2 days after the procedure.  Only take over-the-counter or prescription medicines as directed by your health care provider. Do not take aspirin. It can increase the chances of bleeding.  Take showers instead of baths for 2 weeks or as directed by your health care provider.  Do not drive for 24 hours or as directed.  Do not drink alcohol while taking pain medicine.  Do not use tampons, douche, or have sexual intercourse for 2 weeks or until your health care provider says it is okay.  Take your temperature twice a day for 4-5 days. Write it down each time.  Follow your health care provider's advice about diet, exercise, and lifting.  If you develop constipation, you may:  Take a mild laxative if your health care provider approves.  Add bran foods to your diet.  Drink enough fluids to keep your urine clear or pale yellow.  Try to have someone with you or available to you for the first 24-48 hours, especially if you were given a general anesthetic.  Follow up with your health care provider as directed. SEEK MEDICAL CARE IF:  You feel dizzy or lightheaded.  You feel sick to your stomach (nauseous).  You have abnormal vaginal discharge.  You  have a rash.  You have pain that is not controlled with medicine. SEEK IMMEDIATE MEDICAL CARE IF:  You have bleeding that is heavier than a normal menstrual period.  You have a fever.  You have increasing cramps or pain, not controlled with medicine.  You have new belly (abdominal) pain.  You pass out.  You have pain in the tops of your shoulders (shoulder strap areas).  You have shortness of breath.   This information is not intended to replace advice given to you by your health care provider. Make sure you discuss any questions you have with your health care provider.   Document Released: 02/24/2013 Document Reviewed: 02/24/2013 Elsevier Interactive Patient Education 2016 [place]   1) The drugs that you were given will stay in your system until tomorrow so for the next 24 hours you should not:  A) Drive an automobile B) Make any legal decisions C) Drink any alcoholic beverage   2) You may resume regular meals tomorrow.  Today it is better to start with liquids and gradually work up to solid foods.  You may eat anything you prefer, but it is better to start with liquids, then soup and crackers, and gradually work up to solid foods.   3) Please notify your doctor immediately if you have any unusual bleeding, trouble breathing, redness and pain at the surgery site, drainage, fever, or pain not relieved by medication.    4) Additional Instructions:  Additional Instructions: ° ° ° ° ° ° ° °Please contact your physician with any problems or Same Day Surgery at [phone], Monday through Friday 6 am to 4 pm, or [place] [facility] [place] Main number at [phone]. ° ° ° ° °

## 2015-05-04 NOTE — Anesthesia Postprocedure Evaluation (Signed)
Anesthesia Post Note  Patient: Adriana Jenkins  Procedure(s) Performed: Procedure(s) (LRB): DILATATION & CURETTAGE/HYSTEROSCOPY WITH NOVASURE ABLATION (N/A)  Patient location during evaluation: PACU Anesthesia Type: General Level of consciousness: awake and alert Pain management: pain level controlled Vital Signs Assessment: post-procedure vital signs reviewed and stable Respiratory status: spontaneous breathing, nonlabored ventilation, respiratory function stable and patient connected to nasal cannula oxygen Cardiovascular status: blood pressure returned to baseline and stable Postop Assessment: no signs of nausea or vomiting Anesthetic complications: no    Last Vitals:  Filed Vitals:   05/04/15 1258 05/04/15 1356  BP:    Pulse: 79 78  Temp: 36.9 C   Resp: 16 16    Last Pain:  Filed Vitals:   05/04/15 1357  PainSc: 2                  Precious Haws Cassidie Veiga

## 2015-05-04 NOTE — Transfer of Care (Signed)
Immediate Anesthesia Transfer of Care Note  Patient: Adriana Jenkins  Procedure(s) Performed: Procedure(s): DILATATION & CURETTAGE/HYSTEROSCOPY WITH NOVASURE ABLATION (N/A)  Patient Location: PACU  Anesthesia Type:General  Level of Consciousness: awake, alert  and oriented  Airway & Oxygen Therapy: Patient Spontanous Breathing and Patient connected to face mask oxygen  Post-op Assessment: Report given to RN and Post -op Vital signs reviewed and stable  Post vital signs: Reviewed and stable  Last Vitals:  Filed Vitals:   05/04/15 0926  BP: 122/64  Pulse: 85  Temp: 37.6 C  Resp: 16    Complications: No apparent anesthesia complications

## 2015-05-04 NOTE — OR Nursing (Signed)
Called lab results to Dr Kenton Kingfisher no new orders.

## 2015-05-04 NOTE — H&P (Signed)
History and Physical Interval Note:  05/04/2015 10:38 AM  Adriana Jenkins  has presented today for surgery, with the diagnosis of ENDOMETRIAL POLYPS, MENORRHAGIA  The various methods of treatment have been discussed with the patient and family. After consideration of risks, benefits and other options for treatment, the patient has consented to  Procedure(s): [place] (N/A) as a surgical intervention .  The patient's history has been reviewed, patient examined, no change in status, stable for surgery.  Pt has the following beta blocker history-  Not taking Beta Blocker.  I have reviewed the patient's chart and labs.  Questions were answered to the patient's satisfaction.    HARRIS,ROBERT Eddie Dibbles

## 2015-05-04 NOTE — Op Note (Signed)
Operative Note  05/04/2015  PRE-OP DIAGNOSIS: Abnormal uterine bleeding  POST-OP DIAGNOSIS: same   SURGEON: Barnett Applebaum, MD, FACOG  PROCEDURE: Procedure(s): DILATATION & CURETTAGE/HYSTEROSCOPY    ANESTHESIA: Choice   ESTIMATED BLOOD LOSS: Min   SPECIMENS:  ECC, EMC  FLUID DEFICIT: Min  COMPLICATIONS: None  DISPOSITION: PACU - hemodynamically stable.  CONDITION: stable  FINDINGS: Exam under anesthesia revealed small, mobile small uterus with no masses and bilateral adnexa without masses or fullness. Hysteroscopy revealed a   grossly normal appearing uterine cavity with bilateral tubal ostia and normal appearing endocervical canal.  PROCEDURE IN DETAIL: After informed consent was obtained, the patient was taken to the operating room where anesthesia was obtained without difficulty. The patient was positioned in the dorsal lithotomy position in [place]. The patient's bladder was catheterized with an in and out foley catheter. The patient was examined under anesthesia, with the above noted findings. The weightedspeculum was placed inside the patient's vagina, and the the anterior lip of the cervix was seen and grasped with the tenaculum.  An Endocervical specimen was obtained with a kevorkian curette. The uterine cavity was sounded to 8cm, and then the cervix was progressively dilated to a 18 French-Pratt dilator. The 30 degree hysteroscope was introduced, with saline fluid used to distend the intrauterine cavity, with the above noted findings.  The hystersocope was removed and the uterine cavity was curetted until a gritty texture was noted, yielding endometrial curettings. Excellent hemostasis was noted, and all instruments were removed, with excellent hemostasis noted throughout. She was then taken out of dorsal lithotomy. Minimal discrepancy in fluid was noted.  The patient tolerated the procedure well. Sponge, lap and needle counts were correct x2. The patient was taken to  recovery room in excellent condition.

## 2015-05-05 LAB — SURGICAL PATHOLOGY

## 2015-05-16 ENCOUNTER — Other Ambulatory Visit: Payer: Managed Care, Other (non HMO)

## 2015-12-17 ENCOUNTER — Encounter: Payer: Self-pay | Admitting: Emergency Medicine

## 2015-12-17 ENCOUNTER — Emergency Department
Admission: EM | Admit: 2015-12-17 | Discharge: 2015-12-17 | Disposition: A | Discharge status: Home / Self Care | Payer: Managed Care, Other (non HMO) | Attending: Emergency Medicine | Admitting: Emergency Medicine

## 2015-12-17 DIAGNOSIS — K0889 Other specified disorders of teeth and supporting structures: Secondary | ICD-10-CM | POA: Diagnosis present

## 2015-12-17 MED ORDER — IBUPROFEN 800 MG PO TABS: 800.0000 mg | ORAL_TABLET | Freq: Three times a day (TID) | ORAL | 0 refills | Status: AC | PRN

## 2015-12-17 MED ORDER — OXYCODONE-ACETAMINOPHEN 5-325 MG PO TABS: 1.0000 | ORAL_TABLET | ORAL | 0 refills | Status: DC | PRN

## 2015-12-17 MED ORDER — AMOXICILLIN 500 MG PO TABS: 500.0000 mg | ORAL_TABLET | Freq: Two times a day (BID) | ORAL | 0 refills | Status: DC

## 2015-12-17 MED ORDER — LIDOCAINE VISCOUS 2 % MT SOLN: 20.0000 mL | OROMUCOSAL | 0 refills | Status: AC | PRN

## 2015-12-17 NOTE — ED Provider Notes (Signed)
[facility] Emergency Department Provider Note  ____________________________________________  Time seen: Approximately 7:22 AM  I have reviewed the triage vital signs and the nursing notes.   HISTORY  Chief Complaint Dental Pain    HPI Adriana Jenkins is a 45 y.o. female presents with a 2 day history of right upper dental pain. States unable to get into the dentist next week presents here for help with pain and infection.   Past Medical History:  Diagnosis Date  . Anemia   . Lump or mass in breast     Patient Active Problem List   Diagnosis Date Noted  . Abnormal uterine bleeding (AUB) 05/04/2015  . Mass of breast, left 12/27/2014    Past Surgical History:  Procedure Laterality Date  . BREAST CYST ASPIRATION Right    neg  . BREAST MASS EXCISION Right 09/06/2013   Dr. Marina Gravel  . DILITATION & CURRETTAGE/HYSTROSCOPY WITH NOVASURE ABLATION N/A 05/04/2015   Procedure: DILATATION & CURETTAGE/HYSTEROSCOPY WITH NOVASURE ABLATION;  Surgeon: Gae Dry, MD;  Location: [facility] ORS;  Service: Gynecology;  Laterality: N/A;    Prior to Admission medications   Medication Sig Start Date End Date Taking? Authorizing Provider  amoxicillin (AMOXIL) 500 MG tablet Take 1 tablet (500 mg total) by mouth 2 (two) times daily. 12/17/15   Pierce Crane Cherese Lozano, PA-C  ibuprofen (ADVIL,MOTRIN) 800 MG tablet Take 1 tablet (800 mg total) by mouth every 8 (eight) hours as needed. 12/17/15   Pierce Crane Marco Adelson, PA-C  lidocaine (XYLOCAINE) 2 % solution Use as directed 20 mLs in the mouth or throat as needed for mouth pain. 12/17/15   Pierce Crane Trisa Cranor, PA-C  oxyCODONE-acetaminophen (ROXICET) 5-325 MG tablet Take 1-2 tablets by mouth every 4 (four) hours as needed for severe pain. 12/17/15   Arlyss Repress, PA-C    Allergies Review of patient's allergies indicates no known allergies.  History reviewed. No pertinent family history.  Social History Social History  Substance Use Topics  .  Smoking status: Never Smoker  . Smokeless tobacco: Not on file  . Alcohol use No    Review of Systems Constitutional: No fever/chills Eyes: No visual changes. ENT: Positive for dental pain. Cardiovascular: Denies chest pain. Respiratory: Denies shortness of breath. Musculoskeletal: Negative for back pain. Skin: Negative for rash. Neurological: Negative for headaches, focal weakness or numbness.  10-point ROS otherwise negative.  ____________________________________________   PHYSICAL EXAM:  VITAL SIGNS: ED Triage Vitals  Enc Vitals Group     BP 12/17/15 0710 130/79     Pulse Rate 12/17/15 0710 79     Resp 12/17/15 0710 18     Temp 12/17/15 0710 97.9 F (36.6 C)     Temp Source 12/17/15 0710 Oral     SpO2 12/17/15 0710 99 %     Weight 12/17/15 0710 150 lb (68 kg)     Height 12/17/15 0710 5\' 2"  (1.575 m)     Head Circumference --      Peak Flow --      Pain Score 12/17/15 0709 8     Pain Loc --      Pain Edu? --      Excl. in [place]? --     Constitutional: Alert and oriented. Well appearing and in no acute distress. Head: Atraumatic. Nose: No congestion/rhinnorhea. Mouth/Throat: Mucous membranes are moist.  Oropharynx non-erythematous.Fractured tooth with some obvious dental caries on the right lower molar. Neck: No stridor.  Supple, full range of motion. Cardiovascular: Normal rate,  regular rhythm. Grossly normal heart sounds.  Good peripheral circulation. Respiratory: Normal respiratory effort.  No retractions. Lungs CTAB. Neurologic:  Normal speech and language. No gross focal neurologic deficits are appreciated. No gait instability. Skin:  Skin is warm, dry and intact. No rash noted. Psychiatric: Mood and affect are normal. Speech and behavior are normal.  ____________________________________________   LABS (all labs ordered are listed, but only abnormal results are displayed)  Labs Reviewed - No data to display    PROCEDURES  Procedure(s) performed:  None  Critical Care performed: No  ____________________________________________   INITIAL IMPRESSION / ASSESSMENT AND PLAN / ED COURSE  Pertinent labs & imaging results that were available during my care of the patient were reviewed by me and considered in my medical decision making (see chart for details).  Dental pain with fractured tooth. Rx given for amoxicillin 500 mg, ibuprofen 800, viscous lidocaine and Vicodin. Patient to follow-up with her dentist next week as scheduled.  Clinical Course    ____________________________________________   FINAL CLINICAL IMPRESSION(S) / ED DIAGNOSES  Final diagnoses:  Pain, dental     This chart was dictated using voice recognition software/Dragon. Despite best efforts to proofread, errors can occur which can change the meaning. Any change was purely unintentional.    Arlyss Repress, PA-C 12/17/15 [id number]    Lavonia Drafts, MD 12/17/15 [phone]

## 2015-12-17 NOTE — ED Triage Notes (Signed)
Patient arrives to [facility] ED with complaint of right lower tooth ache since Friday. Has not been seen by DDS.

## 2016-01-11 ENCOUNTER — Emergency Department: Payer: Managed Care, Other (non HMO)

## 2016-01-11 ENCOUNTER — Emergency Department
Admission: EM | Admit: 2016-01-11 | Discharge: 2016-01-11 | Disposition: A | Discharge status: Home / Self Care | Payer: Managed Care, Other (non HMO) | Attending: Emergency Medicine | Admitting: Emergency Medicine

## 2016-01-11 ENCOUNTER — Encounter: Payer: Self-pay | Admitting: Emergency Medicine

## 2016-01-11 DIAGNOSIS — N939 Abnormal uterine and vaginal bleeding, unspecified: Secondary | ICD-10-CM | POA: Diagnosis present

## 2016-01-11 DIAGNOSIS — N938 Other specified abnormal uterine and vaginal bleeding: Secondary | ICD-10-CM | POA: Diagnosis not present

## 2016-01-11 LAB — CBC
HCT: 19.8 % — ABNORMAL LOW (ref 35.0–47.0)
HEMOGLOBIN: 6.3 g/dL — AB (ref 12.0–16.0)
MCH: 21.1 pg — AB (ref 26.0–34.0)
MCHC: 32 g/dL (ref 32.0–36.0)
MCV: 65.9 fL — AB (ref 80.0–100.0)
PLATELETS: 444 10*3/uL — AB (ref 150–440)
RBC: 3 MIL/uL — AB (ref 3.80–5.20)
RDW: 18.6 % — ABNORMAL HIGH (ref 11.5–14.5)
WBC: 6.8 10*3/uL (ref 3.6–11.0)

## 2016-01-11 LAB — URINALYSIS COMPLETE WITH MICROSCOPIC (ARMC ONLY)
Bacteria, UA: NONE SEEN
Bilirubin Urine: NEGATIVE
Glucose, UA: NEGATIVE mg/dL
Hgb urine dipstick: NEGATIVE
KETONES UR: NEGATIVE mg/dL
LEUKOCYTES UA: NEGATIVE
Nitrite: NEGATIVE
PH: 5 (ref 5.0–8.0)
PROTEIN: NEGATIVE mg/dL
RBC / HPF: NONE SEEN RBC/hpf (ref 0–5)
SPECIFIC GRAVITY, URINE: 1.013 (ref 1.005–1.030)

## 2016-01-11 LAB — BASIC METABOLIC PANEL
ANION GAP: 5 (ref 5–15)
BUN: 7 mg/dL (ref 6–20)
CHLORIDE: 107 mmol/L (ref 101–111)
CO2: 26 mmol/L (ref 22–32)
Calcium: 8.3 mg/dL — ABNORMAL LOW (ref 8.9–10.3)
Creatinine, Ser: 0.49 mg/dL (ref 0.44–1.00)
GFR calc Af Amer: >60 mL/min (ref >60)
GLUCOSE: 96 mg/dL (ref 65–99)
POTASSIUM: 3.8 mmol/L (ref 3.5–5.1)
Sodium: 138 mmol/L (ref 135–145)

## 2016-01-11 LAB — POCT PREGNANCY, URINE: PREG TEST UR: NEGATIVE

## 2016-01-11 MED ORDER — MEDROXYPROGESTERONE ACETATE 10 MG PO TABS: 10.0000 mg | ORAL_TABLET | Freq: Every day | ORAL | 0 refills | Status: DC

## 2016-01-11 MED ORDER — FERROUS SULFATE 325 (65 FE) MG PO TABS: 325.0000 mg | ORAL_TABLET | Freq: Two times a day (BID) | ORAL | 1 refills | Status: AC

## 2016-01-11 NOTE — ED Notes (Signed)
Interpreter, MD and this RN at bedside to discuss plan of care with patient. Patient verbalizes understanding. No further needs or questions at this time.

## 2016-01-11 NOTE — ED Notes (Signed)
Patient transported to US 

## 2016-01-11 NOTE — ED Provider Notes (Signed)
-----------------------------------------   4:38 PM on 01/11/2016 -----------------------------------------   Blood pressure (!) 101/57, pulse 84, temperature 97.7 F (36.5 C), temperature source Oral, resp. rate 17, height 5' (1.524 m), weight 150 lb (68 kg), last menstrual period 12/27/2015, SpO2 100 %.  Assuming care from Dr. Kerman Passey of [facility] is a 45 y.o. female with a chief complaint of Vaginal Bleeding and Fatigue .    I was asked to follow up the results of patient's [place] and if no acute findings patient is to be discharged with close follow up with her OBGYN.  US showing : Thickened irregular endometrium. Recommend correlation with beta HCG. Endometrial thickness is considered abnormal. Recommend follow-up by [place] in 6-8 weeks, during the week immediately following menses (exam timing is critical).  Two complex RIGHT ovarian cysts, recommend close attention on previously the follow-up pelvic ultrasound.   Labs showing hgb 6.3 (baseline upper 7s). Patient reports that she was having vaginal bleeding for 2 weeks however the bleeding has stopped for the last 2 days. She endorses fatigue but no CP, SOB, syncope, or dizziness. VS stable. Patient is on iron supplementation. Dr. Kerman Passey discussed hgb findings with Dr. Kenton Kingfisher who recommended not transfusing patient at this time and he will see her in the clinic on Monday. Instructed patient to call his office today to schedule the appointment. Patient is going to be discharged with instructions and prescriptions left by Dr. Kerman Passey.   Rudene Re, MD 01/11/16 [phone]

## 2016-01-11 NOTE — Discharge Instructions (Signed)
You have been seen for vaginal bleeding in the emergency department. Your hemoglobin (blood level) has been found to be low. Please take your medications as prescribed, call the number for Dr. Kenton Kingfisher to arrange a follow-up appointment as soon as possible. Return to the emergency department for any lightheadedness, dizziness, if he passed out, develop chest pain or any trouble breathing.

## 2016-01-11 NOTE — ED Triage Notes (Signed)
Per interpreter pt has had vaginal bleeding for 15 days straight. Pt was brought over from [facility].  Has been seen in the past by GYN and was told to return to clinic if did not get better and pt cannot get appt until the middle of September. Pt states she is weak and has no energy.

## 2016-01-11 NOTE — ED Notes (Signed)
Interpreter at bedside Lannie Fields) to discussing discharge paperwork. Patient verbalizes understanding and knows to call Dr. Kenton Kingfisher as soon as possible. Patient denies further questions or needs at this time.

## 2016-01-11 NOTE — ED Provider Notes (Signed)
[facility] Emergency Department Provider Note  Time seen: 11:18 AM  I have reviewed the triage vital signs and the nursing notes.   HISTORY  Chief Complaint Vaginal Bleeding and Fatigue    HPI Adriana Jenkins is a 45 y.o. female with a past medical history of anemia and abnormal uterine bleeding who presents the emergency department for prolonged vaginal bleeding and generalized weakness. According to the patient she did not have a period in June or July, she began bleeding August 8 and has had bleeding ever since. Patient states for the past few days she has had a feeling of generalized fatigue and weakness and is concerned that she is losing too much blood. States she goes through approximately 3 pads per day, but states they are not saturated. Denies any pain. Denies any abdominal pain. Denies any other bleeding.The patient states this has happened once before and her doctor put her on medication, but her doctor cannot see her until mid-September.  Past Medical History:  Diagnosis Date  . Anemia   . Lump or mass in breast     Patient Active Problem List   Diagnosis Date Noted  . Abnormal uterine bleeding (AUB) 05/04/2015  . Mass of breast, left 12/27/2014    Past Surgical History:  Procedure Laterality Date  . BREAST CYST ASPIRATION Right    neg  . BREAST MASS EXCISION Right 09/06/2013   Dr. Marina Gravel  . DILITATION & CURRETTAGE/HYSTROSCOPY WITH NOVASURE ABLATION N/A 05/04/2015   Procedure: DILATATION & CURETTAGE/HYSTEROSCOPY WITH NOVASURE ABLATION;  Surgeon: Gae Dry, MD;  Location: [facility] ORS;  Service: Gynecology;  Laterality: N/A;    Prior to Admission medications   Medication Sig Start Date End Date Taking? Authorizing Provider  amoxicillin (AMOXIL) 500 MG tablet Take 1 tablet (500 mg total) by mouth 2 (two) times daily. 12/17/15   Pierce Crane Beers, PA-C  ibuprofen (ADVIL,MOTRIN) 800 MG tablet Take 1 tablet (800 mg total) by mouth every 8 (eight)  hours as needed. 12/17/15   Pierce Crane Beers, PA-C  lidocaine (XYLOCAINE) 2 % solution Use as directed 20 mLs in the mouth or throat as needed for mouth pain. 12/17/15   Pierce Crane Beers, PA-C  oxyCODONE-acetaminophen (ROXICET) 5-325 MG tablet Take 1-2 tablets by mouth every 4 (four) hours as needed for severe pain. 12/17/15   Arlyss Repress, PA-C    No Known Allergies  No family history on file.  Social History Social History  Substance Use Topics  . Smoking status: Never Smoker  . Smokeless tobacco: Never Used  . Alcohol use No    Review of Systems Constitutional: Negative for fever. Cardiovascular: Negative for chest pain. Respiratory: Negative for shortness of breath. Gastrointestinal: Negative for abdominal pain Genitourinary: Negative for dysuria. Positive for vaginal bleeding. 10-point ROS otherwise negative.  ____________________________________________   PHYSICAL EXAM:  VITAL SIGNS: ED Triage Vitals  Enc Vitals Group     BP 01/11/16 1052 129/74     Pulse Rate 01/11/16 1049 86     Resp 01/11/16 1049 18     Temp 01/11/16 1049 97.7 F (36.5 C)     Temp Source 01/11/16 1049 Oral     SpO2 01/11/16 1049 100 %     Weight 01/11/16 1053 150 lb (68 kg)     Height 01/11/16 1053 5' (1.524 m)     Head Circumference --      Peak Flow --      Pain Score --  Pain Loc --      Pain Edu? --      Excl. in [place]? --     Constitutional: Alert and oriented. Well appearing and in no distress. Eyes: Normal exam ENT   Head: Normocephalic and atraumatic.   Mouth/Throat: Mucous membranes are moist. Cardiovascular: Normal rate, regular rhythm. No murmur Respiratory: Normal respiratory effort without tachypnea nor retractions. Breath sounds are clear and equal bilaterally.  Gastrointestinal: Soft and nontender. No distention.  Musculoskeletal: Nontender with normal range of motion in all extremities.  Neurologic:  Normal speech and language. No gross focal neurologic deficits   Skin:  Skin is warm, dry and intact.  Psychiatric: Mood and affect are normal.  ____________________________________________    INITIAL IMPRESSION / ASSESSMENT AND PLAN / ED COURSE  Pertinent labs & imaging results that were available during my care of the patient were reviewed by me and considered in my medical decision making (see chart for details).  The patient presents to the emergency department with generalized weakness and prolonged vaginal bleeding. Patient has a history of dysfunctional uterine bleeding in the past. States 3 pads per day, which are not saturated. We will check labs, as long as the patient's labs are within normal limits/unchanged we will discharge with GYN follow-up and likely place the patient on OCPs to help control her symptoms.  Patient's labs have resulted showing anemia with hemoglobin of 6.3. I discussed the patient with Dr. Kenton Kingfisher who does not believe she needs a transfusion at this time as her baseline is around 7.8/7.9. He wishes to obtain a pelvic ultrasound, start the patient on Provera 10 mg per day for the next 10 days as well as iron supplements and have her follow-up in the office next week. I discussed this with the patient with an interpreter, and she is agreeable to this plan. I also discussed very strict return precautions for any lightheadedness dizziness, syncopal episodes, dyspnea, or chest pain.  ____________________________________________   FINAL CLINICAL IMPRESSION(S) / ED DIAGNOSES  Dysfunctional uterine bleeding Symptomatic anemia   Harvest Dark, MD 01/11/16 1401

## 2016-03-04 ENCOUNTER — Other Ambulatory Visit: Payer: Self-pay | Admitting: Surgery

## 2016-03-04 DIAGNOSIS — Z1231 Encounter for screening mammogram for malignant neoplasm of breast: Secondary | ICD-10-CM

## 2016-04-19 ENCOUNTER — Other Ambulatory Visit: Payer: Self-pay | Admitting: Physician Assistant

## 2016-04-19 DIAGNOSIS — Z1231 Encounter for screening mammogram for malignant neoplasm of breast: Secondary | ICD-10-CM

## 2016-06-10 ENCOUNTER — Ambulatory Visit
Admission: RE | Admit: 2016-06-10 | Discharge: 2016-06-10 | Disposition: A | Discharge status: Home / Self Care | Payer: Managed Care, Other (non HMO) | Source: Ambulatory Visit | Attending: Physician Assistant | Admitting: Physician Assistant

## 2016-06-10 DIAGNOSIS — N6001 Solitary cyst of right breast: Secondary | ICD-10-CM | POA: Diagnosis not present

## 2016-06-10 DIAGNOSIS — N6002 Solitary cyst of left breast: Secondary | ICD-10-CM | POA: Insufficient documentation

## 2016-06-10 DIAGNOSIS — Z1231 Encounter for screening mammogram for malignant neoplasm of breast: Secondary | ICD-10-CM | POA: Insufficient documentation

## 2017-05-20 DIAGNOSIS — D649 Anemia, unspecified: Secondary | ICD-10-CM

## 2017-05-20 HISTORY — DX: Anemia, unspecified: D64.9

## 2017-11-28 ENCOUNTER — Other Ambulatory Visit: Payer: Self-pay | Admitting: Physician Assistant

## 2017-11-28 DIAGNOSIS — Z1231 Encounter for screening mammogram for malignant neoplasm of breast: Secondary | ICD-10-CM

## 2017-12-18 ENCOUNTER — Other Ambulatory Visit: Payer: Self-pay | Admitting: Physician Assistant

## 2017-12-18 DIAGNOSIS — N83201 Unspecified ovarian cyst, right side: Secondary | ICD-10-CM

## 2017-12-31 ENCOUNTER — Telehealth: Payer: Self-pay | Admitting: Obstetrics & Gynecology

## 2017-12-31 NOTE — Telephone Encounter (Signed)
[facility] referring for Perimenoppausal Menorrhagia causing Iron deficiency anemia with hemoglobins 8s-9s.. Consult for hysterectomy vs other management options. Voicemail box not set up

## 2018-01-01 ENCOUNTER — Ambulatory Visit
Admission: RE | Admit: 2018-01-01 | Discharge: 2018-01-01 | Disposition: A | Discharge status: Home / Self Care | Payer: Managed Care, Other (non HMO) | Source: Ambulatory Visit | Attending: Physician Assistant | Admitting: Physician Assistant

## 2018-01-01 DIAGNOSIS — Z1231 Encounter for screening mammogram for malignant neoplasm of breast: Secondary | ICD-10-CM | POA: Diagnosis present

## 2018-01-05 NOTE — Telephone Encounter (Signed)
Voice mail box not set up.

## 2018-01-07 ENCOUNTER — Ambulatory Visit
Admission: RE | Admit: 2018-01-07 | Discharge: 2018-01-07 | Disposition: A | Discharge status: Home / Self Care | Payer: Managed Care, Other (non HMO) | Source: Ambulatory Visit | Attending: Physician Assistant | Admitting: Physician Assistant

## 2018-01-07 DIAGNOSIS — R9389 Abnormal findings on diagnostic imaging of other specified body structures: Secondary | ICD-10-CM | POA: Diagnosis not present

## 2018-01-07 DIAGNOSIS — N83201 Unspecified ovarian cyst, right side: Secondary | ICD-10-CM

## 2018-01-07 NOTE — Telephone Encounter (Signed)
Called and spoke with Nira Conn from PCP office advise multiple attempts to reach patient to schedule referral were unsuccessful. Will send letter

## 2018-01-07 NOTE — Telephone Encounter (Signed)
Voice mail box not set up.

## 2018-01-09 ENCOUNTER — Inpatient Hospital Stay: Payer: Managed Care, Other (non HMO) | Admitting: Oncology

## 2018-01-14 ENCOUNTER — Other Ambulatory Visit (HOSPITAL_COMMUNITY)
Admission: RE | Admit: 2018-01-14 | Discharge: 2018-01-14 | Disposition: A | Discharge status: Home / Self Care | Payer: Managed Care, Other (non HMO) | Source: Ambulatory Visit | Attending: Obstetrics & Gynecology | Admitting: Obstetrics & Gynecology

## 2018-01-14 ENCOUNTER — Encounter: Payer: Self-pay | Admitting: Obstetrics & Gynecology

## 2018-01-14 ENCOUNTER — Ambulatory Visit (INDEPENDENT_AMBULATORY_CARE_PROVIDER_SITE_OTHER): Payer: Managed Care, Other (non HMO) | Admitting: Obstetrics & Gynecology

## 2018-01-14 VITALS — BP 120/80 | Ht 60.0 in | Wt 154.0 lb

## 2018-01-14 DIAGNOSIS — D500 Iron deficiency anemia secondary to blood loss (chronic): Secondary | ICD-10-CM | POA: Insufficient documentation

## 2018-01-14 DIAGNOSIS — Z124 Encounter for screening for malignant neoplasm of cervix: Secondary | ICD-10-CM | POA: Insufficient documentation

## 2018-01-14 DIAGNOSIS — N920 Excessive and frequent menstruation with regular cycle: Secondary | ICD-10-CM | POA: Insufficient documentation

## 2018-01-14 NOTE — Patient Instructions (Signed)
Histerectoma total laparoscpica, cuidados posteriores (Total Laparoscopic Hysterectomy, Care After) Siga estas instrucciones durante las prximas semanas. Estas indicaciones le proporcionan informacin acerca de cmo deber cuidarse despus del procedimiento. El mdico tambin podr darle instrucciones ms especficas. El tratamiento se ha planificado de acuerdo a las prcticas mdicas actuales, pero a veces se producen problemas. Comunquese con el mdico si tiene algn problema o tiene dudas despus del procedimiento. QU ESPERAR DESPUS DEL PROCEDIMIENTO  Dolor y hematomas en los sitios de incisin. Le darn analgsicos para Financial controller.  Podr tener sntomas de menopausia, como calor repentino, sudoraciones nocturnas e insomnio si le han extirpado los ovarios.  Podr sentir dolor de garganta por el tubo del respirador que le colocaron durante la [place].  INSTRUCCIONES PARA EL CUIDADO EN EL HOGAR  Utilice los medicamentos de venta libre o recetados para Glass blower/designer, el malestar o la fiebre, segn se lo indique el mdico.  No tome aspirina. Puede ocasionar hemorragias.  No conduzca mientras est tomando analgsicos.  Siga las indicaciones de su mdico con respecto a la dieta, la actividad fsica, levantar objetos, conducir y para las actividades en general.  Reanude su dieta habitual, segn las indicaciones y los permisos.  Descanse y duerma lo suficiente.  Nose haga duchas vaginales, no utilice tampones ni tenga relaciones sexuales durante al menos 6 semanas o hasta que el profesional la autorice.  Cambie los apsitos (vendajes) tal como le indic su mdico.  Controle su temperatura y notifique a su mdico si tiene fiebre.  Tome duchas en lugar de baos durante 2 a 3 semanas.  No beba alcohol hasta que el mdico la autorice.  Si est estreida, tome un laxante suave, si el mdico la [place]. Los alimentos que contienen salvado la ayudarn para el problema de la  estreimiento. Debe ingerir la cantidad de lquidos suficientes para Theatre manager la orina de tono claro o color amarillo plido.  Trate de que alguien la acompae en su casa durante 1 o 2semanas, para ayudarla con los [employer].  Cumpla con todas las visitas de control, segn le indique su mdico.  SOLICITE ATENCIN MDICA SI:  Observa enrojecimiento, hinchazn o siente dolor cada vez ms intenso en los sitios de las incisiones.  Tiene pus en el sitio de la incisin.  Advierte un olor feo que proviene de la incisin.  La incisin se abre.  Se siente mareada o sufre un desmayo.  Siente dolor o tiene una hemorragia al [employer].  Tiene diarrea persistente.  Tiene nuseas o vmitos persistentes.  Tiene flujo vaginal anormal.  Tiene una erupcin cutnea.  Tiene alguna reaccin anormal o aparece una alergia por los medicamentos.  El dolor no se alivia con los medicamentos recetados.  SOLICITE ATENCIN MDICA DE INMEDIATO SI:  Siente falta de aire o dolor en el pecho.  Siente dolor intenso abdominal que no se alivia con los [employer].  Presenta dolor o hinchazn en las piernas.  ASEGRESE DE QUE:  Comprende estas instrucciones.  Controlar su afeccin.  Recibir ayuda de inmediato si no mejora o si empeora.  Esta informacin no tiene Marine scientist el consejo del mdico. Asegrese de hacerle al mdico cualquier pregunta que tenga. Document Released: 02/24/2013 Document Revised: 05/11/2013 Document Reviewed: 11/24/2012 Elsevier Interactive Patient Education  2017 [employer].

## 2018-01-14 NOTE — Progress Notes (Signed)
CC: Excessive Uterine Bleeding Patient complains of heavy menstrual vaginal bleeding. She has been peri-menopausal with reg but heavy periods.  Hysteroscopy D&C 3 years ago for bleeding and thickening, some short term relief afterwards. Now has anemia with low blood counts and fatigue.  Bleeding is described as much heavier than a period, passing clots, and changing protection every few hours and has occurred several times for the last 1-2 years. Other menopausal symptoms include: none. Workup to date: CBC and pelvic ultrasound.  Hormones in past but not recently.  Prefers no hormones.  Recent Hgb 8.9  TRANSABDOMINAL AND TRANSVAGINAL ULTRASOUND OF PELVIS TECHNIQUE: Both transabdominal and transvaginal ultrasound examinations of the pelvis were performed. Transabdominal technique was performed for global imaging of the pelvis including uterus, ovaries, adnexal regions, and pelvic cul-de-sac. It was necessary to proceed with endovaginal exam following the transabdominal exam to visualize the uterus, endometrium and ovaries to better advantage. COMPARISON:  01/11/2016 FINDINGS: Uterus Measurements: 10.2 x 6.8 x 8.7 cm. No fibroids or other mass visualized. Endometrium Thickness: 3.5 cm. There are multiple small cystic areas within the endometrial. No discrete solid mass. No endometrial canal fluid. Right ovary Measurements: 3.4 x 1.0 x 1.7 cm. 18 mm physiologic cyst. No adnexal masses. Left ovary Measurements: 3.0 x 1.6 x 2.1 cm. 18 mm physiologic cyst. No adnexal masses. Other findings No abnormal free fluid.  IMPRESSION: 1. Significant thickening of the endometrium with multiple small cystic areas, but no defined solid mass. If bleeding remains unresponsive to hormonal or medical therapy, focal lesion work-up with sonohysterogram should be considered. Endometrial biopsy should also be considered in pre-menopausal patients at high risk for endometrial carcinoma. (Ref:  Radiological Reasoning: Algorithmic Workup of Abnormal Vaginal Bleeding with Endovaginal Sonography and Sonohysterography. AJR 2008; 226:J33-54) 2. No abnormal ovarian cyst. No adnexal masses. Exam otherwise unremarkable.  Menstrual History: OB History    Gravida  5   Para  4   Term  4   Preterm      AB  1   Living  4     SAB  1   TAB      Ectopic      Multiple      Live Births  4           Patient's last menstrual period was 12/01/2017. Period Cycle (Days): 28 Period Duration (Days): 10-14 Period Pattern: Regular Menstrual Flow: Heavy Dysmenorrhea: None   PMHx: She  has a past medical history of Anemia and Lump or mass in breast. Also,  has a past surgical history that includes Breast mass excision (Right, 09/06/2013); Breast cyst aspiration (Right); Dilatation & currettage/hysteroscopy with novasure ablation (N/A, 05/04/2015); and Breast excisional biopsy (Right, 09/06/2013)., family history is not on file.,  reports that she has never smoked. She has never used smokeless tobacco. She reports that she does not drink alcohol or use drugs.  She has a current medication list which includes the following prescription(s): amoxicillin, ferrous sulfate, ibuprofen, lidocaine, medroxyprogesterone, and oxycodone-acetaminophen. Also, has No Known Allergies.  Review of Systems  Constitutional: Negative for chills, fever and malaise/fatigue.  HENT: Negative for congestion, sinus pain and sore throat.   Eyes: Negative for blurred vision and pain.  Respiratory: Negative for cough and wheezing.   Cardiovascular: Negative for chest pain and leg swelling.  Gastrointestinal: Negative for abdominal pain, constipation, diarrhea, heartburn, nausea and vomiting.  Genitourinary: Negative for dysuria, frequency, hematuria and urgency.  Musculoskeletal: Negative for back pain, joint pain, myalgias and neck pain.  Skin: Negative for itching and rash.  Neurological: Negative for  dizziness, tremors and weakness.  Endo/Heme/Allergies: Does not bruise/bleed easily.  Psychiatric/Behavioral: Negative for depression. The patient is not nervous/anxious and does not have insomnia.     Objective: BP 120/80   Ht 5' (1.524 m)   Wt 154 lb (69.9 kg)   LMP 12/01/2017   BMI 30.08 kg/m  Physical Exam  Constitutional: She is oriented to person, place, and time. She appears well-developed and well-nourished. No distress.  Genitourinary: Vagina normal and uterus normal. Pelvic exam was performed with patient supine. There is no rash, tenderness or lesion on the right labia. There is no rash, tenderness or lesion on the left labia. No erythema or bleeding in the vagina. Right adnexum does not display mass and does not display tenderness. Left adnexum does not display mass and does not display tenderness. Cervix does not exhibit motion tenderness, discharge, polyp or nabothian cyst.   Uterus is mobile and midaxial. Uterus is not enlarged or exhibiting a mass.  HENT:  Head: Normocephalic and atraumatic.  Nose: Nose normal.  Mouth/Throat: Oropharynx is clear and moist.  Abdominal: Soft. She exhibits no distension. There is no tenderness.  Musculoskeletal: Normal range of motion.  Neurological: She is alert and oriented to person, place, and time. No cranial nerve deficit.  Skin: Skin is warm and dry.  Psychiatric: She has a normal mood and affect.   Endometrial Biopsy After discussion with the patient regarding her abnormal uterine bleeding I recommended that she proceed with an endometrial biopsy for further diagnosis. The risks, benefits, alternatives, and indications for an endometrial biopsy were discussed with the patient in detail. She understood the risks including infection, bleeding, cervical laceration and uterine perforation.  Verbal consent was obtained.   PROCEDURE NOTE:  Pipelle endometrial biopsy was performed using aseptic technique with iodine preparation.  The  uterus was sounded to a length of 7 cm.  Adequate sampling was obtained with minimal blood loss.  The patient tolerated the procedure well.  Disposition will be pending pathology.  ASSESSMENT/PLAN:   Problem List Items Addressed This Visit      Other   Menorrhagia with regular cycle - Primary   Iron deficiency anemia due to chronic blood loss    PAP and EMB Patient has abnormal uterine bleeding . She has a normal exam today, with no evidence of lesions.  Evaluation includes the following: exam, labs such as hormonal testing, PAP and EMB, and pelvic ultrasound to evaluate for any structural gynecologic abnormalities.  Patient to follow up after testing.  Treatment option for menorrhagia or menometrorrhagia discussed in great detail with the patient.  Options include hormonal therapy, IUD therapy such as Mirena, D&C, Ablation, and Hysterectomy.  The pros and cons of each option discussed with patient.  Barnett Applebaum, MD, [facility], [facility] 01/14/2018  8:20 AM

## 2018-01-16 LAB — CYTOLOGY - PAP
Diagnosis: NEGATIVE
HPV: NOT DETECTED

## 2018-01-21 ENCOUNTER — Encounter: Payer: Self-pay | Admitting: Obstetrics & Gynecology

## 2018-01-21 ENCOUNTER — Ambulatory Visit: Payer: Managed Care, Other (non HMO) | Admitting: Obstetrics & Gynecology

## 2018-01-21 VITALS — BP 130/80 | Ht 60.0 in | Wt 155.0 lb

## 2018-01-21 DIAGNOSIS — N920 Excessive and frequent menstruation with regular cycle: Secondary | ICD-10-CM

## 2018-01-21 DIAGNOSIS — R9389 Abnormal findings on diagnostic imaging of other specified body structures: Secondary | ICD-10-CM | POA: Diagnosis not present

## 2018-01-21 NOTE — Progress Notes (Signed)
  History of Present Illness:  Adriana Jenkins is a 47 y.o. who recently underwent labwork for PAP and EMB, related to continued menorrhagia and history of polyps and more recent h/o endometrial thickening by [place].  She presents for discussion of results and plan of management. Results revealed normal PAP and EMB.  Recent anemia, on iron therapy.  Does not desire hormone therapy.  PMHx: She  has a past medical history of Anemia and Lump or mass in breast. Also,  has a past surgical history that includes Breast mass excision (Right, 09/06/2013); Breast cyst aspiration (Right); Dilatation & currettage/hysteroscopy with novasure ablation (N/A, 05/04/2015); and Breast excisional biopsy (Right, 09/06/2013)., family history is not on file.,  reports that she has never smoked. She has never used smokeless tobacco. She reports that she does not drink alcohol or use drugs. Current Meds  Medication Sig  . ibuprofen (ADVIL,MOTRIN) 800 MG tablet Take 1 tablet (800 mg total) by mouth every 8 (eight) hours as needed.  . lidocaine (XYLOCAINE) 2 % solution Use as directed 20 mLs in the mouth or throat as needed for mouth pain.  . Also, has No Known Allergies..  Review of Systems  All other systems reviewed and are negative.   Physical Exam:  BP 130/80   Ht 5' (1.524 m)   Wt 155 lb (70.3 kg)   BMI 30.27 kg/m  Body mass index is 30.27 kg/m. Constitutional: Well nourished, well developed female in no acute distress.  Abdomen: diffusely non tender to palpation, non distended, and no masses, hernias Neuro: Grossly intact Psych:  Normal mood and affect.    Results for orders placed or performed in visit on 01/14/18  Cytology - PAP  Result Value Ref Range   Adequacy      Satisfactory for evaluation  endocervical/transformation zone component PRESENT.   Diagnosis      NEGATIVE FOR INTRAEPITHELIAL LESIONS OR MALIGNANCY.   HPV NOT DETECTED    Material Submitted CervicoVaginal Pap [ThinPrep Imaged]   EMB- no  hyperplasia or carcinoma  Assessment:  Problem List Items Addressed This Visit      Other   Menorrhagia with regular cycle - Primary        Endometrial Thickening   Plan: Detailed discussion of results today. Options for management discussed. Info provided. At this time, we will plan for expectant management.     D&C vs Hysterectomy discussed in detail today as options for continued endometrial thickening and menorrhagia. She was amenable to this plan and we will see her back in 6 mos if continues with expectant management plan.  A total of 15 minutes were spent face-to-face with the patient during this encounter and over half of that time dealt with counseling and coordination of care.  Interpreter present.  Barnett Applebaum, MD, [facility], [facility] 01/21/2018  9:31 AM

## 2018-01-28 ENCOUNTER — Encounter: Payer: Self-pay | Admitting: Obstetrics & Gynecology

## 2018-01-28 ENCOUNTER — Ambulatory Visit (INDEPENDENT_AMBULATORY_CARE_PROVIDER_SITE_OTHER): Payer: Managed Care, Other (non HMO) | Admitting: Obstetrics & Gynecology

## 2018-01-28 VITALS — BP 120/70 | Wt 155.0 lb

## 2018-01-28 DIAGNOSIS — N920 Excessive and frequent menstruation with regular cycle: Secondary | ICD-10-CM

## 2018-01-28 DIAGNOSIS — R9389 Abnormal findings on diagnostic imaging of other specified body structures: Secondary | ICD-10-CM | POA: Diagnosis not present

## 2018-01-28 DIAGNOSIS — N939 Abnormal uterine and vaginal bleeding, unspecified: Secondary | ICD-10-CM

## 2018-01-28 MED ORDER — MEDROXYPROGESTERONE ACETATE 10 MG PO TABS: 10.0000 mg | ORAL_TABLET | Freq: Every day | ORAL | 1 refills | Status: DC

## 2018-01-28 NOTE — Patient Instructions (Signed)
Histerectoma total laparoscpica, cuidados posteriores (Total Laparoscopic Hysterectomy, Care After) Siga estas instrucciones durante las prximas semanas. Estas indicaciones le proporcionan informacin acerca de cmo deber cuidarse despus del procedimiento. El mdico tambin podr darle instrucciones ms especficas. El tratamiento se ha planificado de acuerdo a las prcticas mdicas actuales, pero a veces se producen problemas. Comunquese con el mdico si tiene algn problema o tiene dudas despus del procedimiento. QU ESPERAR DESPUS DEL PROCEDIMIENTO  Dolor y hematomas en los sitios de incisin. Le darn analgsicos para Financial controller.  Podr tener sntomas de menopausia, como calor repentino, sudoraciones nocturnas e insomnio si le han extirpado los ovarios.  Podr sentir dolor de garganta por el tubo del respirador que le colocaron durante la [place].  INSTRUCCIONES PARA EL CUIDADO EN EL HOGAR  Utilice los medicamentos de venta libre o recetados para Glass blower/designer, el malestar o la fiebre, segn se lo indique el mdico.  No tome aspirina. Puede ocasionar hemorragias.  No conduzca mientras est tomando analgsicos.  Siga las indicaciones de su mdico con respecto a la dieta, la actividad fsica, levantar objetos, conducir y para las actividades en general.  Reanude su dieta habitual, segn las indicaciones y los permisos.  Descanse y duerma lo suficiente.  Nose haga duchas vaginales, no utilice tampones ni tenga relaciones sexuales durante al menos 6 semanas o hasta que el profesional la autorice.  Cambie los apsitos (vendajes) tal como le indic su mdico.  Controle su temperatura y notifique a su mdico si tiene fiebre.  Tome duchas en lugar de baos durante 2 a 3 semanas.  No beba alcohol hasta que el mdico la autorice.  Si est estreida, tome un laxante suave, si el mdico la [place]. Los alimentos que contienen salvado la ayudarn para el problema de la  estreimiento. Debe ingerir la cantidad de lquidos suficientes para Theatre manager la orina de tono claro o color amarillo plido.  Trate de que alguien la acompae en su casa durante 1 o 2semanas, para ayudarla con los [employer].  Cumpla con todas las visitas de control, segn le indique su mdico.  SOLICITE ATENCIN MDICA SI:  Observa enrojecimiento, hinchazn o siente dolor cada vez ms intenso en los sitios de las incisiones.  Tiene pus en el sitio de la incisin.  Advierte un olor feo que proviene de la incisin.  La incisin se abre.  Se siente mareada o sufre un desmayo.  Siente dolor o tiene una hemorragia al [employer].  Tiene diarrea persistente.  Tiene nuseas o vmitos persistentes.  Tiene flujo vaginal anormal.  Tiene una erupcin cutnea.  Tiene alguna reaccin anormal o aparece una alergia por los medicamentos.  El dolor no se alivia con los medicamentos recetados.  SOLICITE ATENCIN MDICA DE INMEDIATO SI:  Siente falta de aire o dolor en el pecho.  Siente dolor intenso abdominal que no se alivia con los [employer].  Presenta dolor o hinchazn en las piernas.  ASEGRESE DE QUE:  Comprende estas instrucciones.  Controlar su afeccin.  Recibir ayuda de inmediato si no mejora o si empeora.  Esta informacin no tiene Marine scientist el consejo del mdico. Asegrese de hacerle al mdico cualquier pregunta que tenga. Document Released: 02/24/2013 Document Revised: 05/11/2013 Document Reviewed: 11/24/2012 Elsevier Interactive Patient Education  2017 [employer].

## 2018-01-28 NOTE — Progress Notes (Signed)
PRE-OPERATIVE HISTORY AND PHYSICAL EXAM  HPI:  Adriana Jenkins is a 47 y.o. [id number] No LMP recorded.; she is being admitted for surgery related to endometrial thickening on ultrasound associated with menorrhagia and abnormal timeing of bleeding.  This has been worsening over time.  SHe had a D&C Hysteoscopy years ago with brief improvement in symptoms, with return shortly thereafter.  Does not desire hormonal therapy.Marland Kitchen  PMHx: Past Medical History:  Diagnosis Date  . Anemia   . Lump or mass in breast    Past Surgical History:  Procedure Laterality Date  . BREAST CYST ASPIRATION Right    neg  . BREAST EXCISIONAL BIOPSY Right 09/06/2013  . BREAST MASS EXCISION Right 09/06/2013   Dr. Marina Gravel  . DILITATION & CURRETTAGE/HYSTROSCOPY WITH NOVASURE ABLATION N/A 05/04/2015   Procedure: DILATATION & CURETTAGE/HYSTEROSCOPY WITH NOVASURE ABLATION;  Surgeon: Gae Dry, MD;  Location: [facility] ORS;  Service: Gynecology;  Laterality: N/A;   Family History  Problem Relation Age of Onset  . Breast cancer Neg Hx    Social History   Tobacco Use  . Smoking status: Never Smoker  . Smokeless tobacco: Never Used  Substance Use Topics  . Alcohol use: No  . Drug use: No    Current Outpatient Medications:  .  amoxicillin (AMOXIL) 500 MG tablet, Take 1 tablet (500 mg total) by mouth 2 (two) times daily. (Patient not taking: Reported on 01/14/2018), Disp: 20 tablet, Rfl: 0 .  ferrous sulfate 325 (65 FE) MG tablet, Take 1 tablet (325 mg total) by mouth 2 (two) times daily with a meal., Disp: 60 tablet, Rfl: 1 .  ibuprofen (ADVIL,MOTRIN) 800 MG tablet, Take 1 tablet (800 mg total) by mouth every 8 (eight) hours as needed. (Patient not taking: Reported on 01/28/2018), Disp: 30 tablet, Rfl: 0 .  lidocaine (XYLOCAINE) 2 % solution, Use as directed 20 mLs in the mouth or throat as needed for mouth pain. (Patient not taking: Reported on 01/28/2018), Disp: 100 mL, Rfl: 0 .  medroxyPROGESTERone (PROVERA) 10 MG  tablet, Take 1 tablet (10 mg total) by mouth daily for 14 days. Start after first 14 days of premarin, Disp: 14 tablet, Rfl: 1 .  oxyCODONE-acetaminophen (ROXICET) 5-325 MG tablet, Take 1-2 tablets by mouth every 4 (four) hours as needed for severe pain. (Patient not taking: Reported on 01/14/2018), Disp: 15 tablet, Rfl: 0 Allergies: Patient has no known allergies.  Review of Systems  Constitutional: Negative for chills, fever and malaise/fatigue.  HENT: Negative for congestion, sinus pain and sore throat.   Eyes: Negative for blurred vision and pain.  Respiratory: Negative for cough and wheezing.   Cardiovascular: Negative for chest pain and leg swelling.  Gastrointestinal: Negative for abdominal pain, constipation, diarrhea, heartburn, nausea and vomiting.  Genitourinary: Negative for dysuria, frequency, hematuria and urgency.  Musculoskeletal: Negative for back pain, joint pain, myalgias and neck pain.  Skin: Negative for itching and rash.  Neurological: Negative for dizziness, tremors and weakness.  Endo/Heme/Allergies: Does not bruise/bleed easily.  Psychiatric/Behavioral: Negative for depression. The patient is not nervous/anxious and does not have insomnia.    Objective: BP 120/70   Wt 155 lb (70.3 kg)   BMI 30.27 kg/m   Filed Weights   01/28/18 1355  Weight: 155 lb (70.3 kg)   Physical Exam  Constitutional: She is oriented to person, place, and time. She appears well-developed and well-nourished. No distress.  HENT:  Head: Normocephalic and atraumatic. Head is without laceration.  Right Ear: Hearing  normal.  Left Ear: Hearing normal.  Nose: No epistaxis.  No foreign bodies.  Mouth/Throat: Uvula is midline, oropharynx is clear and moist and mucous membranes are normal.  Eyes: Pupils are equal, round, and reactive to light.  Neck: Normal range of motion. Neck supple. No thyromegaly present.  Cardiovascular: Normal rate and regular rhythm. Exam reveals no gallop and no  friction rub.  No murmur heard. Pulmonary/Chest: Effort normal and breath sounds normal. No respiratory distress. She has no wheezes. Right breast exhibits no mass, no skin change and no tenderness. Left breast exhibits no mass, no skin change and no tenderness.  Abdominal: Soft. Bowel sounds are normal. She exhibits no distension. There is no tenderness. There is no rebound.  Musculoskeletal: Normal range of motion.  Neurological: She is alert and oriented to person, place, and time. No cranial nerve deficit.  Skin: Skin is warm and dry.  Psychiatric: She has a normal mood and affect. Judgment normal.  Vitals reviewed.  Assessment: 1. Abnormal uterine bleeding (AUB)   2. Menorrhagia with regular cycle   3. Endometrial thickening on ultrasound   Options for Exp Mgt, Hyst D&C, and Hysterectomy discussed, desires Lap Hyst.  Discussed options for ovarian preservation as well.  I have had a careful discussion with this patient about all the options available and the risk/benefits of each. I have fully informed this patient that surgery may subject her to a variety of discomforts and risks: She understands that most patients have surgery with little difficulty, but problems can happen ranging from minor to fatal. These include nausea, vomiting, pain, bleeding, infection, poor healing, hernia, or formation of adhesions. Unexpected reactions may occur from any drug or anesthetic given. Unintended injury may occur to other pelvic or abdominal structures such as Fallopian tubes, ovaries, bladder, ureter (tube from kidney to bladder), or bowel. Nerves going from the pelvis to the legs may be injured. Any such injury may require immediate or later additional surgery to correct the problem. Excessive blood loss requiring transfusion is very unlikely but possible. Dangerous blood clots may form in the legs or lungs. Physical and sexual activity will be restricted in varying degrees for an indeterminate period of  time but most often 2-6 weeks.  Finally, she understands that it is impossible to list every possible undesirable effect and that the condition for which surgery is done is not always cured or significantly improved, and in rare cases may be even worse.Ample time was given to answer all questions.  Barnett Applebaum, MD, [facility], [facility] 01/28/2018  2:18 PM

## 2018-01-28 NOTE — H&P (View-Only) (Signed)
PRE-OPERATIVE HISTORY AND PHYSICAL EXAM  HPI:  Adriana Jenkins is a 47 y.o. [id number] No LMP recorded.; she is being admitted for surgery related to endometrial thickening on ultrasound associated with menorrhagia and abnormal timeing of bleeding.  This has been worsening over time.  SHe had a D&C Hysteoscopy years ago with brief improvement in symptoms, with return shortly thereafter.  Does not desire hormonal therapy.Marland Kitchen  PMHx: Past Medical History:  Diagnosis Date  . Anemia   . Lump or mass in breast    Past Surgical History:  Procedure Laterality Date  . BREAST CYST ASPIRATION Right    neg  . BREAST EXCISIONAL BIOPSY Right 09/06/2013  . BREAST MASS EXCISION Right 09/06/2013   Dr. Marina Gravel  . DILITATION & CURRETTAGE/HYSTROSCOPY WITH NOVASURE ABLATION N/A 05/04/2015   Procedure: DILATATION & CURETTAGE/HYSTEROSCOPY WITH NOVASURE ABLATION;  Surgeon: Gae Dry, MD;  Location: [facility] ORS;  Service: Gynecology;  Laterality: N/A;   Family History  Problem Relation Age of Onset  . Breast cancer Neg Hx    Social History   Tobacco Use  . Smoking status: Never Smoker  . Smokeless tobacco: Never Used  Substance Use Topics  . Alcohol use: No  . Drug use: No    Current Outpatient Medications:  .  amoxicillin (AMOXIL) 500 MG tablet, Take 1 tablet (500 mg total) by mouth 2 (two) times daily. (Patient not taking: Reported on 01/14/2018), Disp: 20 tablet, Rfl: 0 .  ferrous sulfate 325 (65 FE) MG tablet, Take 1 tablet (325 mg total) by mouth 2 (two) times daily with a meal., Disp: 60 tablet, Rfl: 1 .  ibuprofen (ADVIL,MOTRIN) 800 MG tablet, Take 1 tablet (800 mg total) by mouth every 8 (eight) hours as needed. (Patient not taking: Reported on 01/28/2018), Disp: 30 tablet, Rfl: 0 .  lidocaine (XYLOCAINE) 2 % solution, Use as directed 20 mLs in the mouth or throat as needed for mouth pain. (Patient not taking: Reported on 01/28/2018), Disp: 100 mL, Rfl: 0 .  medroxyPROGESTERone (PROVERA) 10 MG  tablet, Take 1 tablet (10 mg total) by mouth daily for 14 days. Start after first 14 days of premarin, Disp: 14 tablet, Rfl: 1 .  oxyCODONE-acetaminophen (ROXICET) 5-325 MG tablet, Take 1-2 tablets by mouth every 4 (four) hours as needed for severe pain. (Patient not taking: Reported on 01/14/2018), Disp: 15 tablet, Rfl: 0 Allergies: Patient has no known allergies.  Review of Systems  Constitutional: Negative for chills, fever and malaise/fatigue.  HENT: Negative for congestion, sinus pain and sore throat.   Eyes: Negative for blurred vision and pain.  Respiratory: Negative for cough and wheezing.   Cardiovascular: Negative for chest pain and leg swelling.  Gastrointestinal: Negative for abdominal pain, constipation, diarrhea, heartburn, nausea and vomiting.  Genitourinary: Negative for dysuria, frequency, hematuria and urgency.  Musculoskeletal: Negative for back pain, joint pain, myalgias and neck pain.  Skin: Negative for itching and rash.  Neurological: Negative for dizziness, tremors and weakness.  Endo/Heme/Allergies: Does not bruise/bleed easily.  Psychiatric/Behavioral: Negative for depression. The patient is not nervous/anxious and does not have insomnia.    Objective: BP 120/70   Wt 155 lb (70.3 kg)   BMI 30.27 kg/m   Filed Weights   01/28/18 1355  Weight: 155 lb (70.3 kg)   Physical Exam  Constitutional: She is oriented to person, place, and time. She appears well-developed and well-nourished. No distress.  HENT:  Head: Normocephalic and atraumatic. Head is without laceration.  Right Ear: Hearing  normal.  Left Ear: Hearing normal.  Nose: No epistaxis.  No foreign bodies.  Mouth/Throat: Uvula is midline, oropharynx is clear and moist and mucous membranes are normal.  Eyes: Pupils are equal, round, and reactive to light.  Neck: Normal range of motion. Neck supple. No thyromegaly present.  Cardiovascular: Normal rate and regular rhythm. Exam reveals no gallop and no  friction rub.  No murmur heard. Pulmonary/Chest: Effort normal and breath sounds normal. No respiratory distress. She has no wheezes. Right breast exhibits no mass, no skin change and no tenderness. Left breast exhibits no mass, no skin change and no tenderness.  Abdominal: Soft. Bowel sounds are normal. She exhibits no distension. There is no tenderness. There is no rebound.  Musculoskeletal: Normal range of motion.  Neurological: She is alert and oriented to person, place, and time. No cranial nerve deficit.  Skin: Skin is warm and dry.  Psychiatric: She has a normal mood and affect. Judgment normal.  Vitals reviewed.  Assessment: 1. Abnormal uterine bleeding (AUB)   2. Menorrhagia with regular cycle   3. Endometrial thickening on ultrasound   Options for Exp Mgt, Hyst D&C, and Hysterectomy discussed, desires Lap Hyst.  Discussed options for ovarian preservation as well.  I have had a careful discussion with this patient about all the options available and the risk/benefits of each. I have fully informed this patient that surgery may subject her to a variety of discomforts and risks: She understands that most patients have surgery with little difficulty, but problems can happen ranging from minor to fatal. These include nausea, vomiting, pain, bleeding, infection, poor healing, hernia, or formation of adhesions. Unexpected reactions may occur from any drug or anesthetic given. Unintended injury may occur to other pelvic or abdominal structures such as Fallopian tubes, ovaries, bladder, ureter (tube from kidney to bladder), or bowel. Nerves going from the pelvis to the legs may be injured. Any such injury may require immediate or later additional surgery to correct the problem. Excessive blood loss requiring transfusion is very unlikely but possible. Dangerous blood clots may form in the legs or lungs. Physical and sexual activity will be restricted in varying degrees for an indeterminate period of  time but most often 2-6 weeks.  Finally, she understands that it is impossible to list every possible undesirable effect and that the condition for which surgery is done is not always cured or significantly improved, and in rare cases may be even worse.Ample time was given to answer all questions.  Barnett Applebaum, MD, [facility], [facility] 01/28/2018  2:18 PM

## 2018-01-29 ENCOUNTER — Telehealth: Payer: Self-pay | Admitting: Obstetrics & Gynecology

## 2018-01-29 NOTE — Telephone Encounter (Signed)
Attempted to reach the patient via [place], ID# [phone], but there was no answer, v/m not set up.

## 2018-01-30 NOTE — Telephone Encounter (Signed)
Attempted to reach the patient via [place], [place] [id number], but there was no answer, v/m not set up.

## 2018-02-02 NOTE — Telephone Encounter (Signed)
Attempted to reach the patient via [place], [place] 248386, but there was no answer, v/m not set up.

## 2018-02-03 NOTE — Telephone Encounter (Signed)
Patient is aware via [place], [place] [id number], of Pre-admit Testing appointment on 02/10/18 @ 10:00am at [facility] w/ [place] interpreter, and OR on 02/17/18. Directions given. Patient confirmed [employer]. Patient did not have any questions.

## 2018-02-10 ENCOUNTER — Encounter
Admission: RE | Admit: 2018-02-10 | Discharge: 2018-02-10 | Disposition: A | Discharge status: Home / Self Care | Payer: Managed Care, Other (non HMO) | Source: Ambulatory Visit | Attending: Obstetrics & Gynecology | Admitting: Obstetrics & Gynecology

## 2018-02-10 ENCOUNTER — Other Ambulatory Visit: Payer: Self-pay

## 2018-02-10 DIAGNOSIS — Z01812 Encounter for preprocedural laboratory examination: Secondary | ICD-10-CM | POA: Diagnosis not present

## 2018-02-10 LAB — CBC
HCT: 28.7 % — ABNORMAL LOW (ref 35.0–47.0)
Hemoglobin: 9.8 g/dL — ABNORMAL LOW (ref 12.0–16.0)
MCH: 27.8 pg (ref 26.0–34.0)
MCHC: 34 g/dL (ref 32.0–36.0)
MCV: 81.7 fL (ref 80.0–100.0)
PLATELETS: 401 10*3/uL (ref 150–440)
RBC: 3.51 MIL/uL — ABNORMAL LOW (ref 3.80–5.20)
RDW: 23.1 % — AB (ref 11.5–14.5)
WBC: 5.4 10*3/uL (ref 3.6–11.0)

## 2018-02-10 LAB — TYPE AND SCREEN
ABO/RH(D): O POS
ANTIBODY SCREEN: NEGATIVE

## 2018-02-10 NOTE — Pre-Procedure Instructions (Signed)
Spanish interpreter named Sunday Spillers was present for interview. Patient received instructions in [place] and [place]. Questions answered and she felt comfortable with all information obtained.

## 2018-02-10 NOTE — Patient Instructions (Addendum)
Your procedure is scheduled on: Tuesday, February 17, 2018 Su procedimiento est programado para:  Report to [facility] a:  To find out your arrival time please call [phone] between [place] on Monday, February 06, 2018 Para saber su hora de llegada por favor llame al ([place]:   Remember: Instructions that are not followed completely may result in serious medical risk, up to and including death,  or upon the discretion of your surgeon and anesthesiologist your surgery may need to be rescheduled.  Recuerde: Las instrucciones que no se siguen completamente Heritage manager en un riesgo de salud grave, incluyendo hasta  la [place] o a discrecin de su cirujano y Environmental health practitioner, su ciruga se puede posponer.   __X_ 1.Do not eat food after midnight the night before your procedure. No    gum chewing or hard candies.                          You may drink clear liquids up to 2 hours before you are scheduled to arrive for your surgery-                           DO not drink clear liquids within 2 hours of the start of your surgery.     Clear Liquids include: water, apple juice without pulp, clear carbohydrate drink such as    Clearfast of [place], Black Coffee or Tea (Do not add anything to coffee or tea).      No coma nada despus de la medianoche de la noche anterior a su    procedimiento. No coma chicles ni caramelos duros. Puede tomar    lquidos claros hasta 2 horas antes de su hora programada de llegada al     hospital para su procedimiento. No tome lquidos claros durante el     transcurso de las 2 horas de su llegada programada al hospital para su     procedimiento, ya que esto puede llevar a que su procedimiento se    retrase o tenga que volver a Health and safety inspector.  Los lquidos claros incluyen:          - Agua o jugo de [place] sin pulpa          - Bebidas claras con carbohidratos como ClearFast o Gatorade       - Caf negro o t claro (sin leche, sin cremas, no agregue nada al caf ni al t)  No tome nada que no est en esta lista.  Los pacientes con diabetes tipo 1 y tipo 2 solo deben Agricultural engineer.  Llame a la clnica de [facility] o a la unidad de Same Day Surgery si  tiene alguna pregunta sobre estas instrucciones.              _X__ 2.Do Not Smoke or use e-cigarettes For 24 Hours Prior to Your Surgery.    Do not use any chewable tobacco products for at least 6   hours prior to surgery.    No fume ni use cigarrillos electrnicos durante las 24 horas previas    a su [place].  No use ningn producto de tabaco masticable durante   al menos 6 horas antes de la [place].     __X_ 3. No alcohol for 24 hours before or after surgery.    No tome alcohol durante las 24  horas antes ni despus de la ciruga.   ____4. Bring all medications with you on the day of surgery if instructed.    Lleve todos los medicamentos con usted el da de su ciruga si se le    ha indicado as.   ____ 5. Notify your doctor if there is any change in your medical condition (cold,fever, infections).    Informe a su mdico si hay algn cambio en su condicin mdica  (resfriado, fiebre, infecciones).   Do not wear jewelry, make-up, hairpins, clips or nail polish.  No use joyas, maquillajes, pinzas/ganchos para el cabello ni esmalte de uas.  Do not wear lotions, powders, or perfumes. You may wear deodorant.  No use lociones, polvos o perfumes.  Puede usar desodorante.    Do not shave 48 hours prior to surgery. Men may shave face and neck.  No se afeite 48 horas antes de la [place].  Los hombres pueden [employer] cara  y el cuello.   Do not bring valuables to the hospital.   No lleve objetos [facility] is not responsible for any belongings or valuables.  [place] no se hace responsable de ningn tipo de pertenencias u objetos de Geographical information systems officer.               Contacts, dentures or bridgework may not be  worn into surgery.  Los lentes de [place], las dentaduras postizas o puentes no se pueden usar en la [place].   Leave your suitcase in the car. After surgery it may be brought to your room.  Deje su maleta en el auto.  Despus de la ciruga podr traerla a su habitacin.   For patients admitted to the hospital, discharge time is determined by your  treatment team.  Para los pacientes que sean ingresados al hospital, el tiempo en el cual se le  dar de alta es determinado por su equipo de [place].   Patients discharged the day of surgery will not be allowed to drive home. A los pacientes que se les da de alta el mismo da de la ciruga no se les permitir conducir a [place] representative.   Please read over the following fact sheets that you were given: Por favor [place] hojas de informacin que le dieron:  Preparing for surgery   ____ Take these medicines the morning of surgery with A SIP OF WATER:          [employer] medicinas la maana de la ciruga con [place]:  1. none  2.   3.   4.       5.  6.  ____ Fleet Enema (as directed)          Enema de Fleet (segn lo indicado)    _x___ Use CHG Soap as directed          Utilice el jabn de CHG segn lo indicado  _x___ Stop all ASPIRIN PRODUCTS AS OF TODAY          Deje de tomar el Coumadin/Plavix/aspirina el da:  _X___ Stop Anti-inflammatories AS OF TODAY               THIS INCLUDES IBUPROFEN / MOTRIN / ADVIL / ALEVE              Deje de tomar [place]:   ____ Stop supplements until after surgery            Deje de tomar suplementos [employer]  despus de la ciruga  ____ Bring C-Pap to the hospital          Lleve el C-Pap al hospital  WEAR LOOSE FITTING AND COMFORTABLE CLOTHES TO [place]  YOU MAY CONTINUE TAKING YOUR IRON PILLS JUST DO NOT TAKE THEM ON THE DAY OF SURGERY  HAVE A STOOL SOFTENER AT HOME TO BEGIN AFTER SURGERY  HAVE A FIRM PILLOW TO USE AGAINST YOUR BELLY WHEN COUGHING, SNEEZING  AND L AUGHING

## 2018-02-10 NOTE — Pre-Procedure Instructions (Signed)
LABS FAXED TO DR Kenton Kingfisher

## 2018-02-17 ENCOUNTER — Ambulatory Visit: Payer: Managed Care, Other (non HMO) | Admitting: Anesthesiology

## 2018-02-17 ENCOUNTER — Ambulatory Visit
Admission: RE | Admit: 2018-02-17 | Discharge: 2018-02-17 | Disposition: A | Discharge status: Home / Self Care | Payer: Managed Care, Other (non HMO) | Source: Ambulatory Visit | Attending: Obstetrics & Gynecology | Admitting: Obstetrics & Gynecology

## 2018-02-17 ENCOUNTER — Encounter
Admission: RE | Disposition: A | Discharge status: Home / Self Care | Payer: Self-pay | Source: Ambulatory Visit | Attending: Obstetrics & Gynecology

## 2018-02-17 ENCOUNTER — Other Ambulatory Visit: Payer: Self-pay

## 2018-02-17 DIAGNOSIS — D649 Anemia, unspecified: Secondary | ICD-10-CM | POA: Insufficient documentation

## 2018-02-17 DIAGNOSIS — N939 Abnormal uterine and vaginal bleeding, unspecified: Secondary | ICD-10-CM | POA: Diagnosis not present

## 2018-02-17 DIAGNOSIS — R9389 Abnormal findings on diagnostic imaging of other specified body structures: Secondary | ICD-10-CM | POA: Diagnosis present

## 2018-02-17 DIAGNOSIS — N736 Female pelvic peritoneal adhesions (postinfective): Secondary | ICD-10-CM | POA: Diagnosis not present

## 2018-02-17 DIAGNOSIS — D500 Iron deficiency anemia secondary to blood loss (chronic): Secondary | ICD-10-CM | POA: Diagnosis present

## 2018-02-17 DIAGNOSIS — N800 Endometriosis of uterus: Secondary | ICD-10-CM | POA: Insufficient documentation

## 2018-02-17 DIAGNOSIS — K660 Peritoneal adhesions (postprocedural) (postinfection): Secondary | ICD-10-CM | POA: Insufficient documentation

## 2018-02-17 DIAGNOSIS — N802 Endometriosis of fallopian tube: Secondary | ICD-10-CM | POA: Diagnosis not present

## 2018-02-17 DIAGNOSIS — D251 Intramural leiomyoma of uterus: Secondary | ICD-10-CM | POA: Diagnosis not present

## 2018-02-17 DIAGNOSIS — N72 Inflammatory disease of cervix uteri: Secondary | ICD-10-CM | POA: Insufficient documentation

## 2018-02-17 DIAGNOSIS — N920 Excessive and frequent menstruation with regular cycle: Secondary | ICD-10-CM | POA: Diagnosis not present

## 2018-02-17 DIAGNOSIS — N838 Other noninflammatory disorders of ovary, fallopian tube and broad ligament: Secondary | ICD-10-CM | POA: Diagnosis not present

## 2018-02-17 HISTORY — PX: CYSTOSCOPY: SHX5120

## 2018-02-17 HISTORY — PX: LAPAROSCOPIC HYSTERECTOMY: SHX1926

## 2018-02-17 LAB — POCT PREGNANCY, URINE: Preg Test, Ur: NEGATIVE

## 2018-02-17 SURGERY — HYSTERECTOMY, TOTAL, LAPAROSCOPIC
Anesthesia: General

## 2018-02-17 MED ORDER — PHENYLEPHRINE HCL 10 MG/ML IJ SOLN
INTRAMUSCULAR | Status: DC | PRN
  Administered 2018-02-17: 100 ug via INTRAVENOUS
  Administered 2018-02-17: 150 ug via INTRAVENOUS
  Administered 2018-02-17 (×2): 100 ug via INTRAVENOUS

## 2018-02-17 MED ORDER — FENTANYL CITRATE (PF) 100 MCG/2ML IJ SOLN
INTRAMUSCULAR | Status: DC | PRN
  Administered 2018-02-17: 50 ug via INTRAVENOUS
  Administered 2018-02-17: 25 ug via INTRAVENOUS
  Administered 2018-02-17 (×2): 50 ug via INTRAVENOUS
  Administered 2018-02-17: 25 ug via INTRAVENOUS

## 2018-02-17 MED ORDER — LIDOCAINE 2% (20 MG/ML) 5 ML SYRINGE
INTRAMUSCULAR | Status: DC | PRN
  Administered 2018-02-17: 60 mg via INTRAVENOUS

## 2018-02-17 MED ORDER — KETOROLAC TROMETHAMINE 30 MG/ML IJ SOLN
INTRAMUSCULAR | Status: AC
  Filled 2018-02-17: qty 1

## 2018-02-17 MED ORDER — FENTANYL CITRATE (PF) 100 MCG/2ML IJ SOLN
25.0000 ug | INTRAMUSCULAR | Status: AC | PRN
  Administered 2018-02-17 (×6): 25 ug via INTRAVENOUS

## 2018-02-17 MED ORDER — DEXAMETHASONE SODIUM PHOSPHATE 10 MG/ML IJ SOLN
INTRAMUSCULAR | Status: AC
  Filled 2018-02-17: qty 1

## 2018-02-17 MED ORDER — LACTATED RINGERS IV SOLN: INTRAVENOUS | Status: DC

## 2018-02-17 MED ORDER — FENTANYL CITRATE (PF) 100 MCG/2ML IJ SOLN
INTRAMUSCULAR | Status: AC
  Administered 2018-02-17: 25 ug via INTRAVENOUS
  Filled 2018-02-17: qty 2

## 2018-02-17 MED ORDER — SODIUM CHLORIDE 0.9 % IV SOLN
INTRAVENOUS | Status: AC
  Filled 2018-02-17: qty 2

## 2018-02-17 MED ORDER — PROPOFOL 10 MG/ML IV BOLUS
INTRAVENOUS | Status: DC | PRN
  Administered 2018-02-17: 120 mg via INTRAVENOUS

## 2018-02-17 MED ORDER — PROPOFOL 10 MG/ML IV BOLUS
INTRAVENOUS | Status: AC
  Filled 2018-02-17: qty 20

## 2018-02-17 MED ORDER — FENTANYL CITRATE (PF) 100 MCG/2ML IJ SOLN
INTRAMUSCULAR | Status: AC
  Filled 2018-02-17: qty 2

## 2018-02-17 MED ORDER — MIDAZOLAM HCL 5 MG/5ML IJ SOLN
INTRAMUSCULAR | Status: DC | PRN
  Administered 2018-02-17: 2 mg via INTRAVENOUS

## 2018-02-17 MED ORDER — SUGAMMADEX SODIUM 200 MG/2ML IV SOLN
INTRAVENOUS | Status: AC
  Filled 2018-02-17: qty 2

## 2018-02-17 MED ORDER — BUPIVACAINE HCL (PF) 0.5 % IJ SOLN
INTRAMUSCULAR | Status: AC
  Filled 2018-02-17: qty 30

## 2018-02-17 MED ORDER — LACTATED RINGERS IV SOLN
INTRAVENOUS | Status: DC
  Administered 2018-02-17: 09:00:00 via INTRAVENOUS

## 2018-02-17 MED ORDER — ROCURONIUM BROMIDE 100 MG/10ML IV SOLN
INTRAVENOUS | Status: DC | PRN
  Administered 2018-02-17: 15 mg via INTRAVENOUS
  Administered 2018-02-17: 5 mg via INTRAVENOUS
  Administered 2018-02-17: 30 mg via INTRAVENOUS

## 2018-02-17 MED ORDER — OXYCODONE-ACETAMINOPHEN 5-325 MG PO TABS: 1.0000 | ORAL_TABLET | ORAL | 0 refills | Status: AC | PRN

## 2018-02-17 MED ORDER — PROMETHAZINE HCL 25 MG/ML IJ SOLN: 6.2500 mg | INTRAMUSCULAR | Status: DC | PRN

## 2018-02-17 MED ORDER — ONDANSETRON HCL 4 MG/2ML IJ SOLN
INTRAMUSCULAR | Status: AC
  Filled 2018-02-17: qty 2

## 2018-02-17 MED ORDER — FLUORESCEIN SODIUM 10 % IV SOLN
INTRAVENOUS | Status: AC
  Filled 2018-02-17: qty 5

## 2018-02-17 MED ORDER — OXYCODONE-ACETAMINOPHEN 5-325 MG PO TABS
1.0000 | ORAL_TABLET | ORAL | Status: DC | PRN
  Administered 2018-02-17: 1 via ORAL

## 2018-02-17 MED ORDER — ACETAMINOPHEN 325 MG PO TABS: 650.0000 mg | ORAL_TABLET | ORAL | Status: DC | PRN

## 2018-02-17 MED ORDER — ONDANSETRON HCL 4 MG/2ML IJ SOLN
INTRAMUSCULAR | Status: DC | PRN
  Administered 2018-02-17: 4 mg via INTRAVENOUS

## 2018-02-17 MED ORDER — SUCCINYLCHOLINE CHLORIDE 20 MG/ML IJ SOLN
INTRAMUSCULAR | Status: AC
  Filled 2018-02-17: qty 1

## 2018-02-17 MED ORDER — FAMOTIDINE 20 MG PO TABS
ORAL_TABLET | ORAL | Status: AC
  Administered 2018-02-17: 20 mg via ORAL
  Filled 2018-02-17: qty 1

## 2018-02-17 MED ORDER — ROCURONIUM BROMIDE 50 MG/5ML IV SOLN
INTRAVENOUS | Status: AC
  Filled 2018-02-17: qty 1

## 2018-02-17 MED ORDER — DEXAMETHASONE SODIUM PHOSPHATE 4 MG/ML IJ SOLN
INTRAMUSCULAR | Status: DC | PRN
  Administered 2018-02-17: 4 mg via INTRAVENOUS

## 2018-02-17 MED ORDER — OXYCODONE-ACETAMINOPHEN 5-325 MG PO TABS
ORAL_TABLET | ORAL | Status: AC
  Filled 2018-02-17: qty 1

## 2018-02-17 MED ORDER — MIDAZOLAM HCL 2 MG/2ML IJ SOLN
INTRAMUSCULAR | Status: AC
  Filled 2018-02-17: qty 2

## 2018-02-17 MED ORDER — KETOROLAC TROMETHAMINE 30 MG/ML IJ SOLN
INTRAMUSCULAR | Status: DC | PRN
  Administered 2018-02-17: 30 mg via INTRAVENOUS

## 2018-02-17 MED ORDER — FAMOTIDINE 20 MG PO TABS
20.0000 mg | ORAL_TABLET | Freq: Once | ORAL | Status: AC
  Administered 2018-02-17: 20 mg via ORAL

## 2018-02-17 MED ORDER — LIDOCAINE HCL (PF) 2 % IJ SOLN
INTRAMUSCULAR | Status: AC
  Filled 2018-02-17: qty 10

## 2018-02-17 MED ORDER — BUPIVACAINE HCL (PF) 0.5 % IJ SOLN
INTRAMUSCULAR | Status: DC | PRN
  Administered 2018-02-17: 10 mL

## 2018-02-17 MED ORDER — SODIUM CHLORIDE 0.9 % IV SOLN
2.0000 g | INTRAVENOUS | Status: AC
  Administered 2018-02-17: 2 g via INTRAVENOUS

## 2018-02-17 MED ORDER — ACETAMINOPHEN 650 MG RE SUPP
650.0000 mg | RECTAL | Status: DC | PRN
  Filled 2018-02-17: qty 1

## 2018-02-17 MED ORDER — SUCCINYLCHOLINE CHLORIDE 20 MG/ML IJ SOLN
INTRAMUSCULAR | Status: DC | PRN
  Administered 2018-02-17: 100 mg via INTRAVENOUS

## 2018-02-17 MED ORDER — KETOROLAC TROMETHAMINE 30 MG/ML IJ SOLN
30.0000 mg | Freq: Four times a day (QID) | INTRAMUSCULAR | Status: DC
  Filled 2018-02-17: qty 1

## 2018-02-17 MED ORDER — MORPHINE SULFATE (PF) 4 MG/ML IV SOLN: 1.0000 mg | INTRAVENOUS | Status: DC | PRN

## 2018-02-17 MED ORDER — SUGAMMADEX SODIUM 200 MG/2ML IV SOLN
INTRAVENOUS | Status: DC | PRN
  Administered 2018-02-17: 200 mg via INTRAVENOUS

## 2018-02-17 MED ORDER — SODIUM CHLORIDE 0.9 % IV SOLN: 2.0000 g | Freq: Once | INTRAVENOUS | Status: DC

## 2018-02-17 SURGICAL SUPPLY — 53 items
BAG URINE DRAINAGE (UROLOGICAL SUPPLIES) ×3 IMPLANT
BLADE SURG SZ11 CARB STEEL (BLADE) ×3 IMPLANT
CANISTER SUCT 1200ML W/VALVE (MISCELLANEOUS) ×3 IMPLANT
CATH FOLEY 2WAY  5CC 16FR (CATHETERS) ×2
CATH URTH 16FR FL 2W BLN LF (CATHETERS) ×1 IMPLANT
CHLORAPREP W/TINT 26ML (MISCELLANEOUS) ×3 IMPLANT
DEFOGGER SCOPE WARMER CLEARIFY (MISCELLANEOUS) ×3 IMPLANT
DERMABOND ADVANCED (GAUZE/BANDAGES/DRESSINGS) ×2
DERMABOND ADVANCED .7 DNX12 (GAUZE/BANDAGES/DRESSINGS) ×1 IMPLANT
DEVICE SUTURE ENDOST 10MM (ENDOMECHANICALS) IMPLANT
DRAPE CAMERA CLOSED 9X96 (DRAPES) ×3 IMPLANT
DRSG TEGADERM 2-3/8X2-3/4 SM (GAUZE/BANDAGES/DRESSINGS) IMPLANT
GLOVE BIO SURGEON STRL SZ8 (GLOVE) ×15 IMPLANT
GLOVE INDICATOR 8.0 STRL GRN (GLOVE) ×11 IMPLANT
GOWN STRL REUS W/ TWL LRG LVL3 (GOWN DISPOSABLE) ×1 IMPLANT
GOWN STRL REUS W/ TWL XL LVL3 (GOWN DISPOSABLE) ×2 IMPLANT
GOWN STRL REUS W/TWL LRG LVL3 (GOWN DISPOSABLE) ×2
GOWN STRL REUS W/TWL XL LVL3 (GOWN DISPOSABLE) ×4
GRASPER SUT TROCAR 14GX15 (MISCELLANEOUS) ×3 IMPLANT
IRRIGATION STRYKERFLOW (MISCELLANEOUS) ×1 IMPLANT
IRRIGATOR STRYKERFLOW (MISCELLANEOUS) ×3
IV LACTATED RINGERS 1000ML (IV SOLUTION) ×6 IMPLANT
KIT PINK PAD W/HEAD ARE REST (MISCELLANEOUS) ×3
KIT PINK PAD W/HEAD ARM REST (MISCELLANEOUS) ×1 IMPLANT
KIT TURNOVER CYSTO (KITS) ×3 IMPLANT
LABEL OR SOLS (LABEL) ×3 IMPLANT
MANIPULATOR VCARE LG CRV RETR (MISCELLANEOUS) IMPLANT
MANIPULATOR VCARE SML CRV RETR (MISCELLANEOUS) IMPLANT
MANIPULATOR VCARE STD CRV RETR (MISCELLANEOUS) ×2 IMPLANT
NEEDLE VERESS 14GA 120MM (NEEDLE) ×3 IMPLANT
NS IRRIG 500ML POUR BTL (IV SOLUTION) ×3 IMPLANT
OCCLUDER COLPOPNEUMO (BALLOONS) ×3 IMPLANT
PACK GYN LAPAROSCOPIC (MISCELLANEOUS) ×3 IMPLANT
PAD OB MATERNITY 4.3X12.25 (PERSONAL CARE ITEMS) ×3 IMPLANT
PAD PREP 24X41 OB/GYN DISP (PERSONAL CARE ITEMS) ×3 IMPLANT
PORT ACCESS TROCAR AIRSEAL 12 (TROCAR) IMPLANT
PORT ACCESS TROCAR AIRSEAL 5M (TROCAR) ×2
SCISSORS METZENBAUM CVD 33 (INSTRUMENTS) ×2 IMPLANT
SET CYSTO W/LG BORE CLAMP LF (SET/KITS/TRAYS/PACK) ×3 IMPLANT
SET TRI-LUMEN FLTR TB AIRSEAL (TUBING) ×4 IMPLANT
SHEARS HARMONIC ACE PLUS 36CM (ENDOMECHANICALS) ×3 IMPLANT
SLEEVE ENDOPATH XCEL 5M (ENDOMECHANICALS) ×3 IMPLANT
SPONGE GAUZE 2X2 8PLY STER LF (GAUZE/BANDAGES/DRESSINGS)
SPONGE GAUZE 2X2 8PLY STRL LF (GAUZE/BANDAGES/DRESSINGS) IMPLANT
SUT ENDO VLOC 180-0-8IN (SUTURE) ×2 IMPLANT
SUT VIC AB 0 CT1 36 (SUTURE) ×3 IMPLANT
SUT VIC AB 4-0 FS2 27 (SUTURE) ×3 IMPLANT
SYR 10ML LL (SYRINGE) ×3 IMPLANT
SYR 50ML LL SCALE MARK (SYRINGE) ×3 IMPLANT
SYSTEM WECK SHIELD CLOSURE (TROCAR) ×2 IMPLANT
TROCAR ENDO BLADELESS 11MM (ENDOMECHANICALS) ×1 IMPLANT
TROCAR XCEL NON-BLD 5MMX100MML (ENDOMECHANICALS) ×3 IMPLANT
TUBING INSUF HEATED (TUBING) ×3 IMPLANT

## 2018-02-17 NOTE — Anesthesia Preprocedure Evaluation (Signed)
Anesthesia Evaluation  Patient identified by MRN, date of birth, ID band Patient awake    Reviewed: Allergy & Precautions, H&P , NPO status , Patient's Chart, lab work & pertinent test results  History of Anesthesia Complications (+) PONV and history of anesthetic complications  Airway Mallampati: III  TM Distance: >3 FB Neck ROM: full    Dental  (+) Poor Dentition, Chipped, Dental Advidsory Given   Pulmonary neg pulmonary ROS, neg shortness of breath,           Cardiovascular Exercise Tolerance: Good (-) Past MI negative cardio ROS       Neuro/Psych negative neurological ROS  negative psych ROS   GI/Hepatic negative GI ROS, Neg liver ROS,   Endo/Other  negative endocrine ROS  Renal/GU negative Renal ROS  negative genitourinary   Musculoskeletal   Abdominal   Peds  Hematology  (+) anemia ,   Anesthesia Other Findings Past Medical History:   Lump or mass in breast                                       Anemia                                                      Past Surgical History:   BREAST MASS EXCISION                            Right 09/06/2013     Comment:Dr. Marina Gravel   BREAST CYST ASPIRATION                          Right                Comment:neg  BMI    Body Mass Index   29.29 kg/m 2      Reproductive/Obstetrics negative OB ROS                             Anesthesia Physical  Anesthesia Plan  ASA: II  Anesthesia Plan: General   Post-op Pain Management:    Induction: Intravenous  PONV Risk Score and Plan: 4 or greater and Ondansetron, Dexamethasone, Midazolam, Promethazine and Treatment may vary due to age or medical condition  Airway Management Planned: Oral ETT  Additional Equipment:   Intra-op Plan:   Post-operative Plan: Extubation in OR  Informed Consent: I have reviewed the patients History and Physical, chart, labs and discussed the procedure  including the risks, benefits and alternatives for the proposed anesthesia with the patient or authorized representative who has indicated his/her understanding and acceptance.   Dental Advisory Given  Plan Discussed with: Anesthesiologist, CRNA and Surgeon  Anesthesia Plan Comments:         Anesthesia Quick Evaluation

## 2018-02-17 NOTE — Op Note (Signed)
Operative Report:  PRE-OP DIAGNOSIS: endometrial thickening on [place], menorrhagia   POST-OP DIAGNOSIS: endometrial thickening on [place], menorrhagia   PROCEDURE: Procedure(s): HYSTERECTOMY TOTAL LAPAROSCOPIC  BILATERAL SALPINGECTOMY RIGHT OOPHORECTOMY CYSTOSCOPY  SURGEON: Barnett Applebaum, MD, FACOG  ASSISTANT: Dr Georgianne Fick, No other capable assistant available, in surgery requiring high level assistant.  ANESTHESIA: General endotracheal anesthesia  ESTIMATED BLOOD LOSS: less than 100   SPECIMENS: Uterus, Rigth Ovary, Tubes.  COMPLICATIONS: None  DISPOSITION: stable to PACU  FINDINGS: Intraabdominal adhesions were noted. Considerable adhesive disease, possibly related to endometriosis, was found w bilateral adnexa as well as sigmoid to posterior uterus.  PROCEDURE:  The patient was taken to the OR where anesthesia was administed. She was prepped and draped in the normal sterile fashion in the dorsal lithotomy position in the [place] stirrups. A time out was performed. A Graves speculum was inserted, the cervix was grasped with a single tooth tenaculum and the endometrial cavity was sounded. The cervix was progressively dilated to a size 18 [place] with [employer] dilators. A V-Care uterine manipulator was inserted in the usual fashion without incident. Gloves were changed and attention was turned to the abdomen.   An infraumbilical transverse 12mm skin incision was made with the scalpel after local anesthesia applied to the skin. A Veress-step needle was inserted in the usual fashion and confirmed using the hanging drop technique. A pneumoperitoneum was obtained by insufflation of CO2 (opening pressure of 16mmHg) to 49mmHg. A diagnostic laparoscopy was performed yielding the previously described findings. Attention was turned to the left lower quadrant where after visualization of the inferior epigastric vessels a 63mm skin incision was made with the scalpel. A 5 mm laparoscopic port was inserted. The same  procedure was repeated in the right lower quadrant with a 79mm trocar. Attention was turned to the left aspect of the uterus, where after visualization of the ureter, the round ligament was coagulated and transected using the 39mm Harmonic Scapel. Particular attention to the adhesions with meticulous dissection is performed with adhesions on both adnexa and sigmoid to uterus attachments.  The right ureter was difficult to visualize and concern for its involvement was always in mind. The anterior and posterior leafs of the broad ligament were dissected off as the anterior one was coagulated and transected in a caudal direction towards the cuff of the uterine manipulator.  Attention was then turned to the left fallopian tube which was recognized by visualization of the fimbria. The tube is excised to its attachment to the uterus. The uterine-ovarian ligament and its blood vessels were carefully coagulated and transected using the Harmonic scapel.    Attention was turned to the right aspect of the uterus where the entire adnexa was deemed necessary to excise due to its aggressive attachment to the uterus with adhesive disease.  The round ligament is coagulated and transected with the Harmonic scapel.  The ovarian artery and infundibulopelvic ligaments were coagulated and transected; always keeping close to the uterus with the dissection to avoid potential ureter.  The uterine-ovarian ligament and its blood vessels were carefully coagulated and transected using the Harmonic scapel.    The vesicouterine reflection of the peritoneum was dissected with the harmonic scapel and the bladder flap was created bluntly.  The uterine vessels were coagulated and transected bilaterally using the harmonic scapel. A 360 degree, circumferential colpotomy was done to completely amputate the uterus with cervix and tubes. Once the specimen was amputated it was delivered through the vagina.   The colpotomy was repaired in  a simple  running fashion using a delayed absorbable suture with an endo-stitch device.  Vaginal exam confirms complete closure.  The cavity was copiously irrigated. A survey of the pelvic cavity revealed adequate hemostasis and no injury to bowel, bladder, or ureter.   A diagnostic cystoscopy was performed using saline distension of bladder with no lesions or injuries noted.  Bilateral urine flow from each ureteral orifice is visualized.  At this point the procedure was finalized. Right lower quadrant fascia incision is closed with a vicryl suture using the fascia closure device. All the instruments were removed from the patient's body. Gas was expelled and patient is leveled.  Incisions are closed with skin adhesive.    Patient goes to recovery room in stable condition.  All sponge, instrument, and needle counts are correct x2.     Barnett Applebaum, MD, [facility], [facility] 02/17/2018  11:44 AM

## 2018-02-17 NOTE — Anesthesia Post-op Follow-up Note (Signed)
Anesthesia QCDR form completed.        

## 2018-02-17 NOTE — Discharge Instructions (Signed)
AMBULATORY SURGERY  DISCHARGE INSTRUCTIONS   1) The drugs that you were given will stay in your system until tomorrow so for the next 24 hours you should not:  A) Drive an automobile B) Make any legal decisions C) Drink any alcoholic beverage   2) You may resume regular meals tomorrow.  Today it is better to start with liquids and gradually work up to solid foods.  You may eat anything you prefer, but it is better to start with liquids, then soup and crackers, and gradually work up to solid foods.   3) Please notify your doctor immediately if you have any unusual bleeding, trouble breathing, redness and pain at the surgery site, drainage, fever, or pain not relieved by medication. 4)   5) Your post-operative visit with Dr.                                     is: Date:                        Time:    Please call to schedule your post-operative visit.  6) Additional Instructions:      Total Laparoscopic Hysterectomy, Care After Refer to this sheet in the next few weeks. These instructions provide you with information on caring for yourself after your procedure. Your health care provider may also give you more specific instructions. Your treatment has been planned according to current medical practices, but problems sometimes occur. Call your health care provider if you have any problems or questions after your procedure. What can I expect after the procedure?  Pain and bruising at the incision sites. You will be given pain medicine to control it.  Menopausal symptoms such as hot flashes, night sweats, and insomnia if your ovaries were removed.  Sore throat from the breathing tube that was inserted during surgery. Follow these instructions at home:  Only take over-the-counter or prescription medicines for pain, discomfort, or fever as directed by your health care provider.  Do not take aspirin. It can cause bleeding.  Do not drive when taking pain medicine.  Follow your  health care provider's advice regarding diet, exercise, lifting, driving, and general activities.  Resume your usual diet as directed and allowed.  Get plenty of rest and sleep.  Do not douche, use tampons, or have sexual intercourse for at least 6 weeks, or until your health care provider gives you permission.  Change your bandages (dressings) as directed by your health care provider.  Monitor your temperature and notify your health care provider of a fever.  Take showers instead of baths for 2-3 weeks.  Do not drink alcohol until your health care provider gives you permission.  If you develop constipation, you may take a mild laxative with your health care provider's permission. Bran foods may help with constipation problems. Drinking enough fluids to keep your urine clear or pale yellow may help as well.  Try to have someone home with you for 1-2 weeks to help around the house.  Keep all of your follow-up appointments as directed by your health care provider. Contact a health care provider if:  You have swelling, redness, or increasing pain around your incision sites.  You have pus coming from your incision.  You notice a bad smell coming from your incision.  Your incision breaks open.  You feel dizzy or lightheaded.  You have pain  or bleeding when you urinate.  You have persistent diarrhea.  You have persistent nausea and vomiting.  You have abnormal vaginal discharge.  You have a rash.  You have any type of abnormal reaction or develop an allergy to your medicine.  You have poor pain control with your prescribed medicine. Get help right away if:  You have chest pain or shortness of breath.  You have severe abdominal pain that is not relieved with pain medicine.  You have pain or swelling in your legs. This information is not intended to replace advice given to you by your health care provider. Make sure you discuss any questions you have with your health care  provider. Document Released: 02/24/2013 Document Revised: 10/12/2015 Document Reviewed: 11/24/2012 Elsevier Interactive Patient Education  2017 [employer].

## 2018-02-17 NOTE — Transfer of Care (Signed)
Immediate Anesthesia Transfer of Care Note  Patient: Nandika Stetzer  Procedure(s) Performed: HYSTERECTOMY TOTAL LAPAROSCOPIC BILATERAL SALPINGECTOMY (N/A ) CYSTOSCOPY (N/A )  Patient Location: PACU  Anesthesia Type:General  Level of Consciousness: drowsy and responds to stimulation  Airway & Oxygen Therapy: Patient Spontanous Breathing  Post-op Assessment: Report given to RN and Post -op Vital signs reviewed and stable  Post vital signs: Reviewed  Last Vitals:  Vitals Value Taken Time  BP 125/69   Temp 99   Pulse 111 02/17/2018 11:55 AM  Resp 17 02/17/2018 11:55 AM  SpO2 100 % 02/17/2018 11:55 AM  Vitals shown include unvalidated device data.  Last Pain:  Vitals:   02/17/18 0915  TempSrc: Temporal  PainSc:          Complications: No apparent anesthesia complications

## 2018-02-17 NOTE — Anesthesia Procedure Notes (Addendum)
Procedure Name: Intubation Date/Time: 02/17/2018 9:58 AM Performed by: Demetrius Charity, CRNA Pre-anesthesia Checklist: Patient identified, Patient being monitored, Timeout performed, Emergency Drugs available and Suction available Patient Re-evaluated:Patient Re-evaluated prior to induction Oxygen Delivery Method: Circle system utilized Preoxygenation: Pre-oxygenation with 100% oxygen Induction Type: IV induction Ventilation: Mask ventilation without difficulty Laryngoscope Size: Miller and 2 Grade View: Grade I Tube type: Oral Tube size: 7.0 mm Number of attempts: 1 Airway Equipment and Method: Stylet Placement Confirmation: ETT inserted through vocal cords under direct vision,  positive ETCO2,  breath sounds checked- equal and bilateral and CO2 detector Secured at: 22 cm Tube secured with: Tape Dental Injury: Teeth and Oropharynx as per pre-operative assessment  Comments: Performed by N. Tschudi SRNA

## 2018-02-17 NOTE — Interval H&P Note (Signed)
History and Physical Interval Note:  02/17/2018 8:48 AM  Adriana Jenkins  has presented today for surgery, with the diagnosis of endometrial thickening on [place], menorrhagia  The various methods of treatment have been discussed with the patient and family. After consideration of risks, benefits and other options for treatment, the patient has consented to  Procedure(s): HYSTERECTOMY TOTAL LAPAROSCOPIC BILATERAL SALPINGECTOMY (N/A) as a surgical intervention .  The patient's history has been reviewed, patient examined, no change in status, stable for surgery.  I have reviewed the patient's chart and labs.  Questions were answered to the patient's satisfaction.     Hoyt Koch

## 2018-02-17 NOTE — Anesthesia Postprocedure Evaluation (Signed)
Anesthesia Post Note  Patient: Toriana Sponsel  Procedure(s) Performed: HYSTERECTOMY TOTAL LAPAROSCOPIC BILATERAL SALPINGECTOMY (N/A ) CYSTOSCOPY (N/A )  Patient location during evaluation: PACU Anesthesia Type: General Level of consciousness: awake and alert Pain management: pain level controlled Vital Signs Assessment: post-procedure vital signs reviewed and stable Respiratory status: spontaneous breathing, nonlabored ventilation, respiratory function stable and patient connected to nasal cannula oxygen Cardiovascular status: blood pressure returned to baseline and stable Postop Assessment: no apparent nausea or vomiting Anesthetic complications: no     Last Vitals:  Vitals:   02/17/18 1235 02/17/18 1246  BP: 102/66 112/73  Pulse: 100 96  Resp: (!) 22 (!) 25  Temp:    SpO2: 93% 95%    Last Pain:  Vitals:   02/17/18 1246  TempSrc:   PainSc: 5                  Molli Barrows

## 2018-02-18 ENCOUNTER — Encounter: Payer: Self-pay | Admitting: Obstetrics & Gynecology

## 2018-02-19 ENCOUNTER — Telehealth: Payer: Self-pay

## 2018-02-19 NOTE — Telephone Encounter (Signed)
-----   Message from Gae Dry, MD sent at 02/19/2018 10:28 AM EDT ----- Regarding: post op Call and check on how she is after hysterectomy Tues

## 2018-02-19 NOTE — Telephone Encounter (Signed)
Pt states she is in little pain. I advised pt she should feel better each day, to let us know if she needs anything,

## 2018-02-23 LAB — SURGICAL PATHOLOGY

## 2018-03-06 ENCOUNTER — Ambulatory Visit (INDEPENDENT_AMBULATORY_CARE_PROVIDER_SITE_OTHER): Payer: Managed Care, Other (non HMO) | Admitting: Obstetrics & Gynecology

## 2018-03-06 ENCOUNTER — Encounter: Payer: Self-pay | Admitting: Obstetrics & Gynecology

## 2018-03-06 VITALS — BP 100/60 | Ht 61.0 in | Wt 153.0 lb

## 2018-03-06 DIAGNOSIS — N736 Female pelvic peritoneal adhesions (postinfective): Secondary | ICD-10-CM

## 2018-03-06 DIAGNOSIS — Z9889 Other specified postprocedural states: Secondary | ICD-10-CM

## 2018-03-06 DIAGNOSIS — R9389 Abnormal findings on diagnostic imaging of other specified body structures: Secondary | ICD-10-CM

## 2018-03-06 DIAGNOSIS — N939 Abnormal uterine and vaginal bleeding, unspecified: Secondary | ICD-10-CM

## 2018-03-06 NOTE — Progress Notes (Signed)
  Postoperative Follow-up Patient presents post op from [place], BS, RO for abnormal uterine bleeding and fibroids, 2 weeks ago. Imaging:  Pathology: DIAGNOSIS:  A. UTERUS WITH CERVIX AND BILATERAL FALLOPIAN TUBE; TOTAL HYSTERECTOMY  WITH BILATERAL SALPINGECTOMY:  - CHRONIC CERVICITIS WITH SQUAMOUS METAPLASIA.  - LATE SECRETORY ENDOMETRIUM WITH EARLY BREAKDOWN.  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  - BILATERAL FALLOPIAN TUBES WITH BENIGN PARATUBAL CYSTS AND  ENDOMETRIOSIS.  - FIBROUS SEROSAL ADHESIONS.  - NEGATIVE FOR ATYPIA AND MALIGNANCY.  Subjective: Patient reports some improvement in her preop symptoms. Eating a regular diet without difficulty. Pain is controlled with current analgesics. Medications being used: ibuprofen (OTC).  Activity: sedentary. Patient reports additional symptom's since surgery of No bleeding  Objective: BP 100/60   Ht 5\' 1"  (1.549 m)   Wt 153 lb (69.4 kg)   LMP 01/22/2018 Comment: bleeds often  BMI 28.91 kg/m  Physical Exam  Constitutional: She is oriented to person, place, and time. She appears well-developed and well-nourished. No distress.  Cardiovascular: Normal rate.  Pulmonary/Chest: Effort normal.  Abdominal: Soft. She exhibits no distension. There is no tenderness.  Incision Healing Well   Musculoskeletal: Normal range of motion.  Neurological: She is alert and oriented to person, place, and time. No cranial nerve deficit.  Skin: Skin is warm and dry.  Psychiatric: She has a normal mood and affect.    Assessment: s/p :  right oophorectomy and total laparoscopic hysterectomy with bilateral salpingectomy progressing well  Plan: Patient has done well after surgery with no apparent complications.  I have discussed the post-operative course to date, and the expected progress moving forward.  The patient understands what complications to be concerned about.  I will see the patient in routine follow  up, or sooner if needed.    Activity plan: No heavy lifting. Pelvic rest RTW 10/28, note Discussed reasons for oophorectomy.  Monitor for menopause as would with one or two ovaries.  Hoyt Koch 03/06/2018, 8:10 AM

## 2018-03-13 ENCOUNTER — Telehealth: Payer: Self-pay

## 2018-03-13 NOTE — Telephone Encounter (Signed)
Pt daughter calling, stating Pt needs a note for work stating she can not lift anything heavy for 1 month, pt is going back to work Monday, Pt aware you are not in the office and I will send a message to see if you approve of the note. Pt daughter name is VNA  And  Phone [phone]

## 2018-03-16 NOTE — Telephone Encounter (Signed)
OK for note

## 2018-03-16 NOTE — Telephone Encounter (Signed)
Pt daughter in law calling again stating her Mother in law will be at the office in an hour to pick up note. Thank you!

## 2018-03-17 ENCOUNTER — Telehealth: Payer: Self-pay

## 2018-03-17 NOTE — Telephone Encounter (Signed)
FMLA/DISABILITY form filled out for [employer], signature obtained and given to [place] for processing.

## 2018-04-03 ENCOUNTER — Ambulatory Visit (INDEPENDENT_AMBULATORY_CARE_PROVIDER_SITE_OTHER): Payer: Managed Care, Other (non HMO) | Admitting: Obstetrics & Gynecology

## 2018-04-03 ENCOUNTER — Encounter: Payer: Self-pay | Admitting: Obstetrics & Gynecology

## 2018-04-03 VITALS — BP 110/70 | Ht 60.0 in | Wt 155.0 lb

## 2018-04-03 DIAGNOSIS — R9389 Abnormal findings on diagnostic imaging of other specified body structures: Secondary | ICD-10-CM

## 2018-04-03 DIAGNOSIS — N939 Abnormal uterine and vaginal bleeding, unspecified: Secondary | ICD-10-CM

## 2018-04-03 NOTE — Progress Notes (Signed)
  Postoperative Follow-up Patient presents post op from [place], BS, RO  for abnormal uterine bleeding, 6 weeks ago.  Subjective: Patient reports marked improvement in her preop symptoms. Eating a regular diet without difficulty. The patient is not having any pain.  Activity: normal activities of daily living. Patient reports additional symptom's since surgery of None.  Objective: BP 110/70   Ht 5' (1.524 m)   Wt 155 lb (70.3 kg)   LMP 01/22/2018 Comment: bleeds often  BMI 30.27 kg/m  Physical Exam  Constitutional: She is oriented to person, place, and time. She appears well-developed and well-nourished. No distress.  Genitourinary: Rectum normal and vagina normal. Pelvic exam was performed with patient supine. There is no rash, tenderness or lesion on the right labia. There is no rash, tenderness or lesion on the left labia. No erythema or bleeding in the vagina. Right adnexum does not display mass and does not display tenderness. Left adnexum does not display mass and does not display tenderness.  Genitourinary Comments: Cervix and uterus absent. Vaginal cuff healing well.  Cardiovascular: Normal rate.  Pulmonary/Chest: Effort normal.  Abdominal: Soft. She exhibits no distension. There is no tenderness.  Incision healing well.  Musculoskeletal: Normal range of motion.  Neurological: She is alert and oriented to person, place, and time. No cranial nerve deficit.  Skin: Skin is warm and dry.  Psychiatric: She has a normal mood and affect.    Assessment: s/p :  right oophorectomy and total laparoscopic hysterectomy with bilateral salpingectomy progressing well  Plan: Patient has done well after surgery with no apparent complications.  I have discussed the post-operative course to date, and the expected progress moving forward.  The patient understands what complications to be concerned about.  I will see the patient in routine follow up, or sooner if needed.    Activity plan: No  restriction.  F/u yearly  Hoyt Koch 04/03/2018, 8:30 AM

## 2019-01-21 ENCOUNTER — Other Ambulatory Visit: Payer: Self-pay | Admitting: Family Medicine

## 2019-01-21 DIAGNOSIS — Z1231 Encounter for screening mammogram for malignant neoplasm of breast: Secondary | ICD-10-CM

## 2019-04-02 ENCOUNTER — Ambulatory Visit
Admission: RE | Admit: 2019-04-02 | Discharge: 2019-04-02 | Disposition: A | Discharge status: Home / Self Care | Payer: No Typology Code available for payment source | Source: Ambulatory Visit | Attending: Family Medicine | Admitting: Family Medicine

## 2019-04-02 DIAGNOSIS — Z1231 Encounter for screening mammogram for malignant neoplasm of breast: Secondary | ICD-10-CM | POA: Diagnosis not present

## 2020-03-07 ENCOUNTER — Other Ambulatory Visit: Payer: Self-pay | Admitting: Family Medicine

## 2020-03-07 DIAGNOSIS — Z1231 Encounter for screening mammogram for malignant neoplasm of breast: Secondary | ICD-10-CM

## 2020-03-18 IMAGING — MG MM DIGITAL SCREENING BILAT W/ TOMO W/ CAD
8 series · 8 of 24 positions shown · non-contrast
Comparison: Previous exam(s).

CLINICAL DATA: Screening.

EXAM:
DIGITAL SCREENING BILATERAL MAMMOGRAM WITH TOMO AND CAD

[L CC synth-2D]
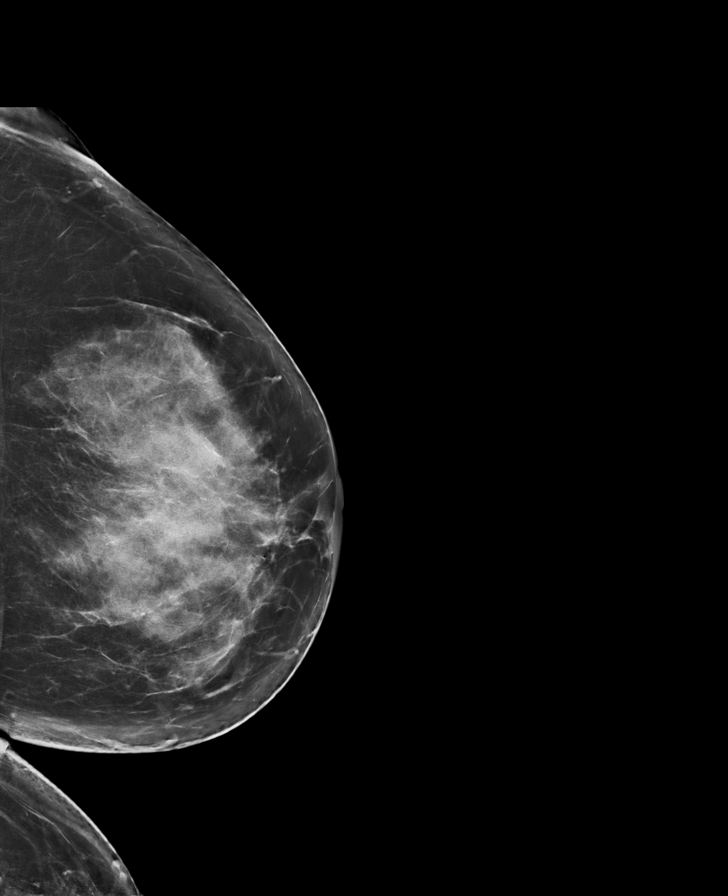

[R CC synth-2D]
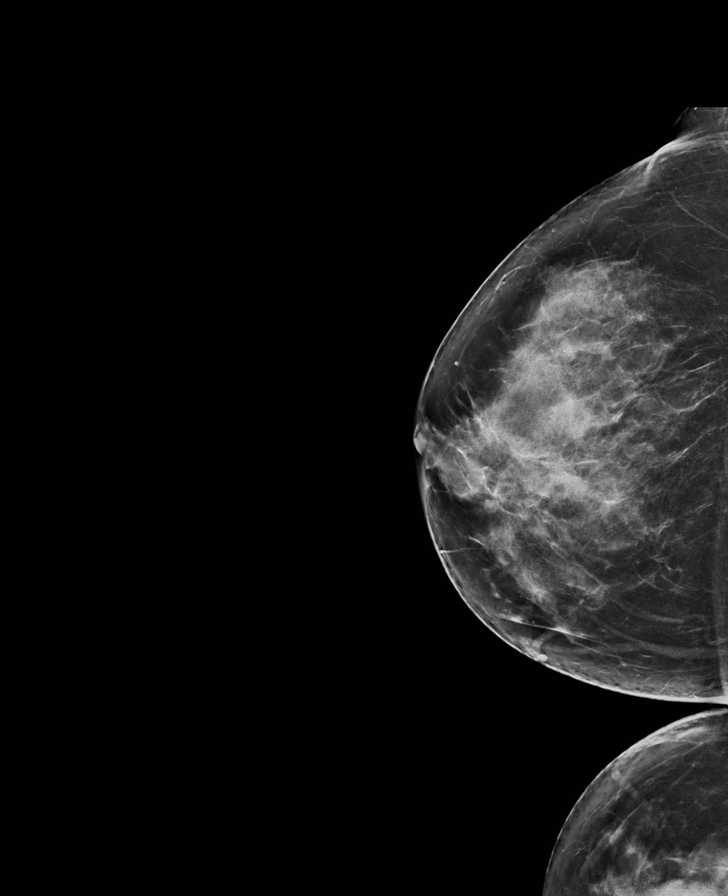

[R MLO synth-2D]
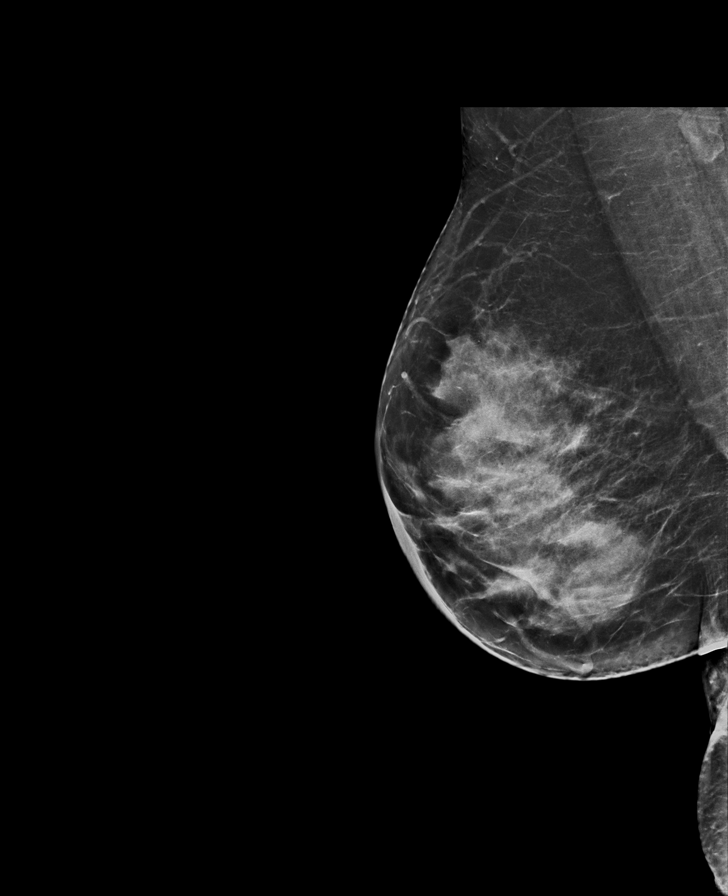

[L MLO synth-2D]
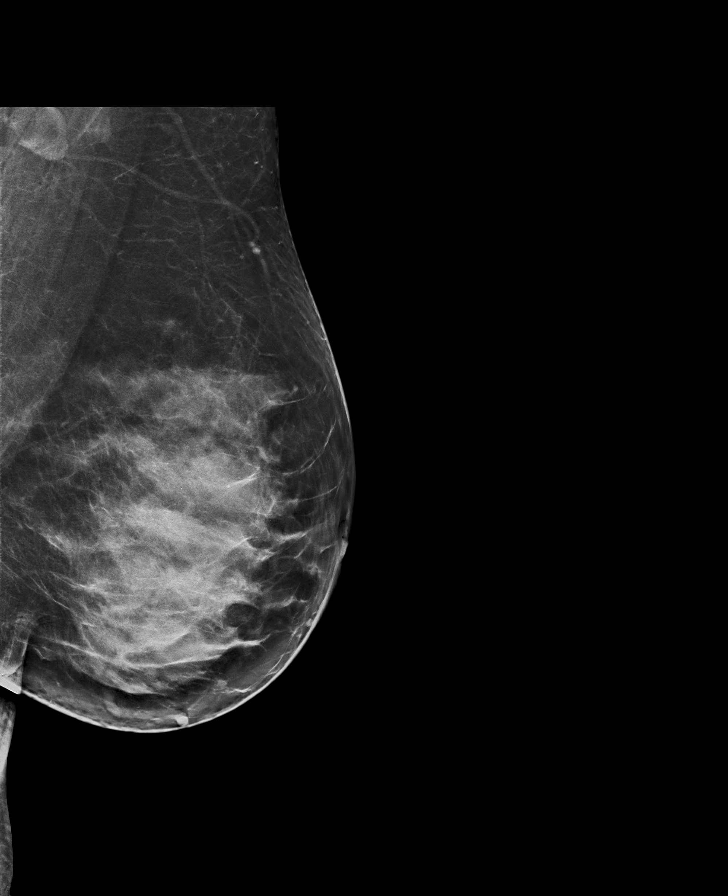

[R CC tomo · tomo slice 37/73.0]
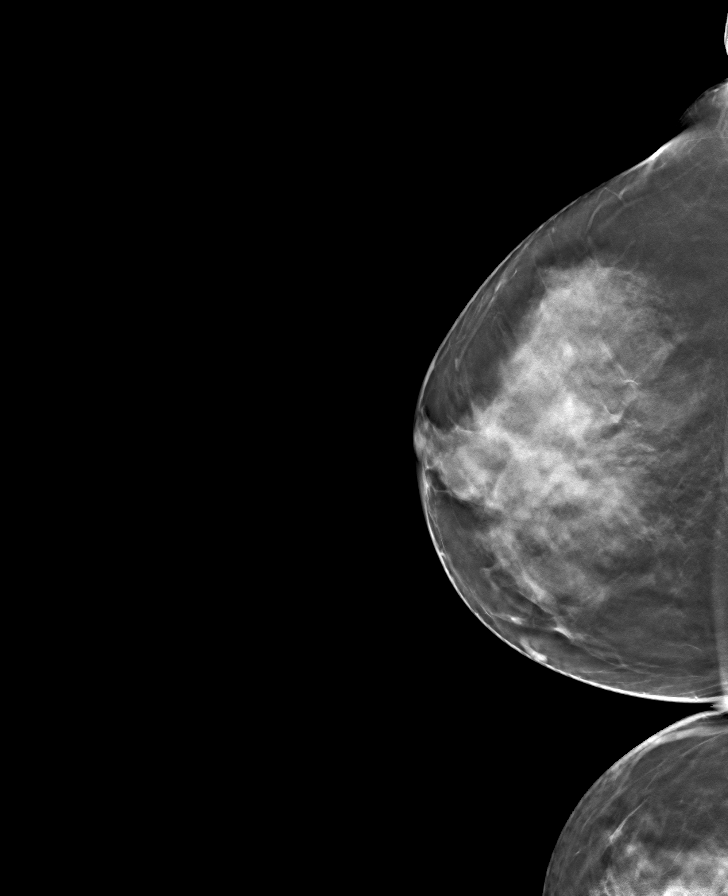

[L CC tomo · tomo slice 40/79.0]
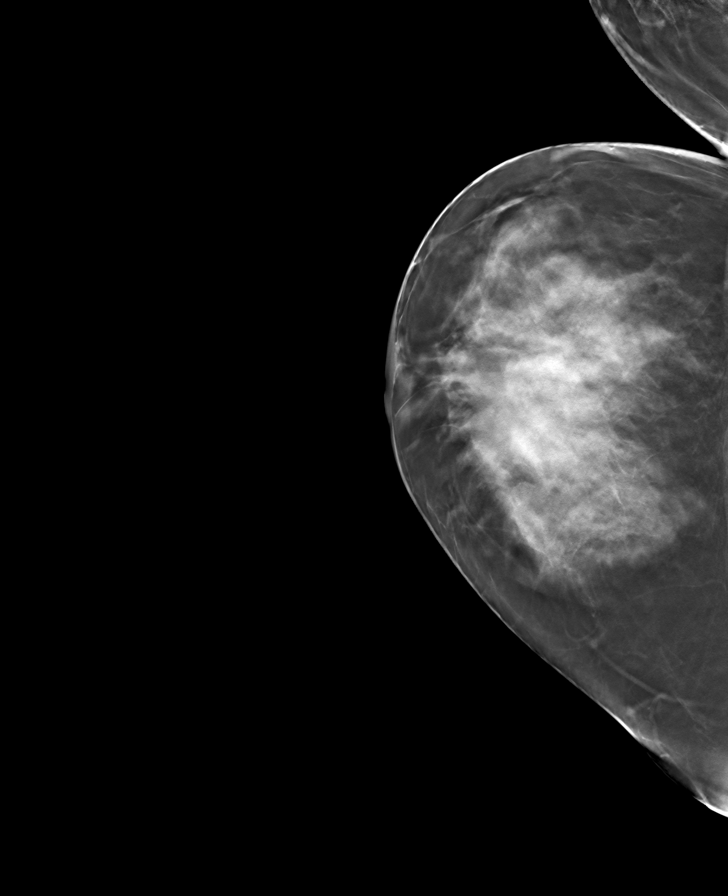

[R MLO tomo · tomo slice 37/74.0]
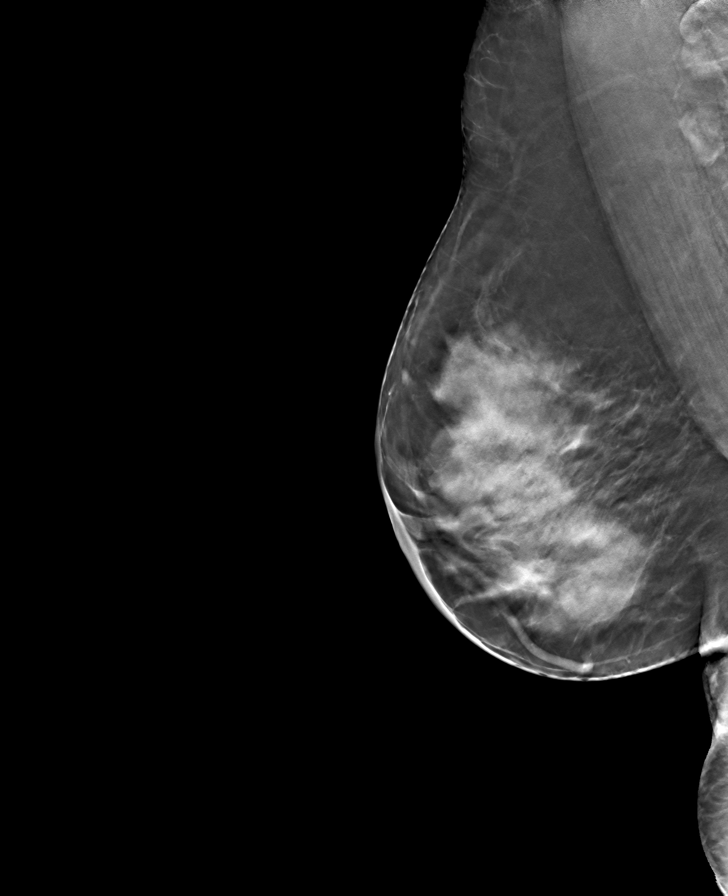

[L MLO tomo · tomo slice 43/86.0]
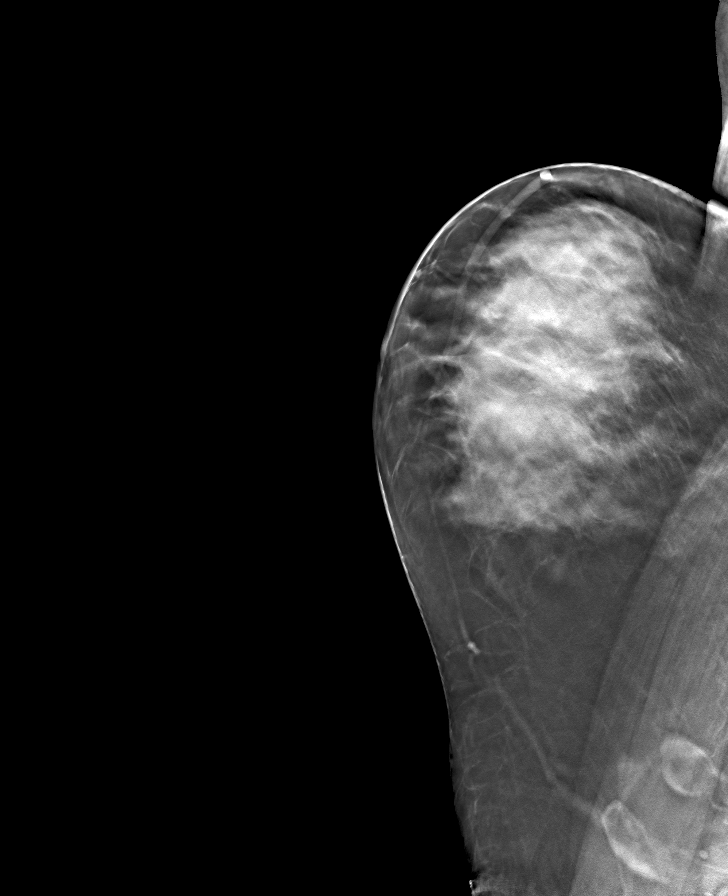

[8 of 24 positions shown; findings below may reference images not displayed]

ACR Breast Density Category d: The breast tissue is extremely dense,
which lowers the sensitivity of mammography.
FINDINGS: There are no findings suspicious for malignancy. Images were
processed with CAD.
IMPRESSION: No mammographic evidence of malignancy. A result letter of this
screening mammogram will be mailed directly to the patient.

RECOMMENDATION:
Screening mammogram in one year. (Code:RA-I-AVB)

BI-RADS CATEGORY  1: Negative.

## 2020-10-31 ENCOUNTER — Ambulatory Visit
Admission: RE | Admit: 2020-10-31 | Discharge: 2020-10-31 | Disposition: A | Discharge status: Home / Self Care | Payer: PRIVATE HEALTH INSURANCE | Source: Ambulatory Visit | Attending: Family Medicine | Admitting: Family Medicine

## 2020-10-31 ENCOUNTER — Other Ambulatory Visit: Payer: Self-pay

## 2020-10-31 DIAGNOSIS — Z1231 Encounter for screening mammogram for malignant neoplasm of breast: Secondary | ICD-10-CM | POA: Insufficient documentation

## 2020-12-01 ENCOUNTER — Other Ambulatory Visit: Payer: Self-pay | Admitting: Family Medicine

## 2020-12-01 ENCOUNTER — Other Ambulatory Visit (HOSPITAL_COMMUNITY): Payer: Self-pay | Admitting: Family Medicine

## 2020-12-01 DIAGNOSIS — R1901 Right upper quadrant abdominal swelling, mass and lump: Secondary | ICD-10-CM

## 2020-12-25 ENCOUNTER — Other Ambulatory Visit: Payer: Self-pay | Admitting: Family Medicine

## 2020-12-25 ENCOUNTER — Ambulatory Visit
Admission: RE | Admit: 2020-12-25 | Discharge: 2020-12-25 | Disposition: A | Discharge status: Home / Self Care | Payer: PRIVATE HEALTH INSURANCE | Source: Ambulatory Visit | Attending: Family Medicine | Admitting: Family Medicine

## 2020-12-25 ENCOUNTER — Other Ambulatory Visit: Payer: Self-pay

## 2020-12-25 DIAGNOSIS — R1901 Right upper quadrant abdominal swelling, mass and lump: Secondary | ICD-10-CM | POA: Insufficient documentation

## 2022-09-06 ENCOUNTER — Encounter: Payer: Self-pay | Admitting: Nurse Practitioner

## 2023-06-24 ENCOUNTER — Encounter: Payer: Self-pay | Admitting: Physician Assistant

## 2023-07-29 ENCOUNTER — Other Ambulatory Visit: Payer: Self-pay

## 2023-07-29 DIAGNOSIS — Z1231 Encounter for screening mammogram for malignant neoplasm of breast: Secondary | ICD-10-CM

## 2023-08-11 ENCOUNTER — Ambulatory Visit
Admission: RE | Admit: 2023-08-11 | Discharge: 2023-08-11 | Disposition: A | Discharge status: Home / Self Care | Payer: Self-pay | Source: Ambulatory Visit | Attending: Obstetrics and Gynecology | Admitting: Obstetrics and Gynecology

## 2023-08-11 ENCOUNTER — Ambulatory Visit: Payer: Self-pay | Attending: Hematology and Oncology | Admitting: Hematology and Oncology

## 2023-08-11 VITALS — BP 143/82 | Wt 172.1 lb

## 2023-08-11 DIAGNOSIS — Z1231 Encounter for screening mammogram for malignant neoplasm of breast: Secondary | ICD-10-CM | POA: Insufficient documentation

## 2023-08-11 MED ORDER — NYSTATIN 100000 UNIT/GM EX POWD: 1.0000 | Freq: Three times a day (TID) | CUTANEOUS | 3 refills | Status: AC

## 2023-08-11 NOTE — Progress Notes (Signed)
 Ms. Adriana Jenkins is a 53 y.o. female who presents to [facility] clinic today with no complaints.    Pap Smear: Pap not smear completed today. Last Pap smear was 08/2022 at [facility] clinic and was normal. Per patient has no history of an abnormal Pap smear. Last Pap smear result is available in Epic.   Physical exam: Breasts Breasts symmetrical. No skin abnormalities bilateral breasts. No nipple retraction bilateral breasts. No nipple discharge bilateral breasts. No lymphadenopathy. No lumps palpated bilateral breasts.       Pelvic/Bimanual Pap is not indicated today    Smoking History: Patient has never smoked and was not referred to quit line.    Patient Navigation: Patient education provided. Access to services provided for patient through [facility] program. Adriana Jenkins interpreter provided. No transportation provided   Colorectal Cancer Screening: Per patient has never had colonoscopy completed No complaints today.    Breast and Cervical Cancer Risk Assessment: Patient does not have family history of breast cancer, known genetic mutations, or radiation treatment to the chest before age 37. Patient does not have history of cervical dysplasia, immunocompromised, or DES exposure in-utero.  Risk Scores as of Encounter on 08/11/2023     Adriana Jenkins           5-year 0.88%   Lifetime 6.55%   This patient is [place]/Lat[place] but has no documented birth country, so the [place] model used data from [place] patients to calculate their risk score. Document a birth country in the Demographics activity for a more accurate score.         Last calculated by Adriana Rutherford, LPN on 1/61/0960 at  2:38 PM        A: [facility] exam without pap smear No complaints with benign exam.   P: Referred patient to the [facility] for a screening mammogram. Appointment scheduled 08/11/2023.  Adriana Lux, NP 08/11/2023 2:39 PM

## 2023-08-11 NOTE — Patient Instructions (Signed)
 Taught Adriana Jenkins about self breast awareness and gave educational materials to take home. Patient did not need a Pap smear today due to last Pap smear was in 08/2022 per patient. Told patient about free cervical cancer screenings to receive a Pap smear if would like one next year. Let her know [facility] will cover Pap smears every 5 years unless has a history of abnormal Pap smears. Referred patient to the [facility] for screening mammogram. Appointment scheduled for 08/11/2023. Patient aware of appointment and will be there. Let patient know will follow up with her within the next couple weeks with results. Jessiah Huss verbalized understanding.  Pascal Lux, NP 2:39 PM

## 2023-08-13 ENCOUNTER — Other Ambulatory Visit: Payer: Self-pay | Admitting: Obstetrics and Gynecology

## 2023-08-13 DIAGNOSIS — R928 Other abnormal and inconclusive findings on diagnostic imaging of breast: Secondary | ICD-10-CM

## 2023-08-22 ENCOUNTER — Ambulatory Visit
Admission: RE | Admit: 2023-08-22 | Discharge: 2023-08-22 | Disposition: A | Discharge status: Home / Self Care | Payer: Self-pay | Source: Ambulatory Visit | Attending: Obstetrics and Gynecology | Admitting: Obstetrics and Gynecology

## 2023-08-22 DIAGNOSIS — R928 Other abnormal and inconclusive findings on diagnostic imaging of breast: Secondary | ICD-10-CM | POA: Insufficient documentation
